# Patient Record
Sex: Female | Born: 1990 | Race: Black or African American | Hispanic: No | Marital: Married | Smoking: Never smoker
Health system: Southern US, Community
[De-identification: ages and names within clinical notes are randomized; demographics above are authoritative.]

## PROBLEM LIST (undated history)

## (undated) DIAGNOSIS — Z8632 Personal history of gestational diabetes: Secondary | ICD-10-CM

## (undated) DIAGNOSIS — E282 Polycystic ovarian syndrome: Secondary | ICD-10-CM

## (undated) DIAGNOSIS — G932 Benign intracranial hypertension: Secondary | ICD-10-CM

## (undated) DIAGNOSIS — O24419 Gestational diabetes mellitus in pregnancy, unspecified control: Secondary | ICD-10-CM

## (undated) DIAGNOSIS — E119 Type 2 diabetes mellitus without complications: Secondary | ICD-10-CM

## (undated) DIAGNOSIS — T7491XA Unspecified adult maltreatment, confirmed, initial encounter: Secondary | ICD-10-CM

## (undated) HISTORY — DX: Gestational diabetes mellitus in pregnancy, unspecified control: O24.419

## (undated) HISTORY — DX: Unspecified adult maltreatment, confirmed, initial encounter: T74.91XA

## (undated) HISTORY — DX: Polycystic ovarian syndrome: E28.2

## (undated) HISTORY — PX: NO PAST SURGERIES: SHX2092

## (undated) HISTORY — DX: Personal history of gestational diabetes: Z86.32

---

## 2017-06-14 LAB — OB RESULTS CONSOLE HEPATITIS B SURFACE ANTIGEN: HEP B S AG: NEGATIVE

## 2017-06-14 LAB — OB RESULTS CONSOLE ABO/RH: RH TYPE: POSITIVE

## 2017-06-14 LAB — OB RESULTS CONSOLE ANTIBODY SCREEN: Antibody Screen: NEGATIVE

## 2017-06-14 LAB — OB RESULTS CONSOLE RUBELLA ANTIBODY, IGM: Rubella: IMMUNE

## 2017-06-14 LAB — OB RESULTS CONSOLE HIV ANTIBODY (ROUTINE TESTING): HIV: NONREACTIVE

## 2017-06-14 LAB — OB RESULTS CONSOLE GC/CHLAMYDIA
CHLAMYDIA, DNA PROBE: NEGATIVE
Gonorrhea: NEGATIVE

## 2017-06-14 LAB — OB RESULTS CONSOLE RPR: RPR: NONREACTIVE

## 2017-06-17 ENCOUNTER — Inpatient Hospital Stay (HOSPITAL_COMMUNITY): Admission: AD | Admit: 2017-06-17 | Payer: Self-pay | Source: Ambulatory Visit | Admitting: Obstetrics and Gynecology

## 2017-09-16 ENCOUNTER — Other Ambulatory Visit: Payer: Self-pay | Admitting: Obstetrics and Gynecology

## 2017-09-16 DIAGNOSIS — N644 Mastodynia: Secondary | ICD-10-CM

## 2017-09-23 ENCOUNTER — Other Ambulatory Visit (HOSPITAL_COMMUNITY): Payer: Self-pay | Admitting: *Deleted

## 2017-09-23 DIAGNOSIS — N644 Mastodynia: Secondary | ICD-10-CM

## 2017-09-28 ENCOUNTER — Encounter: Payer: Medicaid Other | Attending: Obstetrics and Gynecology | Admitting: Registered"

## 2017-09-28 ENCOUNTER — Encounter: Payer: Self-pay | Admitting: Registered"

## 2017-09-28 DIAGNOSIS — O24410 Gestational diabetes mellitus in pregnancy, diet controlled: Secondary | ICD-10-CM | POA: Insufficient documentation

## 2017-09-28 DIAGNOSIS — Z713 Dietary counseling and surveillance: Secondary | ICD-10-CM | POA: Diagnosis not present

## 2017-09-28 DIAGNOSIS — R7309 Other abnormal glucose: Secondary | ICD-10-CM

## 2017-09-28 DIAGNOSIS — Z3A00 Weeks of gestation of pregnancy not specified: Secondary | ICD-10-CM | POA: Diagnosis not present

## 2017-09-28 NOTE — Progress Notes (Signed)
Patient was seen on 09/28/2017 for Gestational Diabetes self-management class at the [facility]. The following learning objectives were met by the patient during this course:   States the definition of Gestational Diabetes  States why dietary management is important in controlling blood glucose  Describes the effects each nutrient has on blood glucose levels  Demonstrates ability to create a balanced meal plan  Demonstrates carbohydrate counting   States when to check blood glucose levels  Demonstrates proper blood glucose monitoring techniques  States the effect of stress and exercise on blood glucose levels  States the importance of limiting caffeine and abstaining from alcohol and smoking  Blood glucose monitor given: Accu-Chek Guide Lot # [id number] Exp: 11/23/17 Blood glucose reading: 82  Patient instructed to monitor glucose levels: FBS: 60 - <95 1 hour: <140 2 hour: <120  Patient received handouts:  Nutrition Diabetes and Pregnancy  Carbohydrate Counting List  Patient will be seen for follow-up as needed.

## 2017-10-25 ENCOUNTER — Encounter (HOSPITAL_COMMUNITY): Payer: Self-pay | Admitting: *Deleted

## 2017-10-25 ENCOUNTER — Ambulatory Visit (HOSPITAL_COMMUNITY)
Admission: RE | Admit: 2017-10-25 | Discharge: 2017-10-25 | Disposition: A | Discharge status: Home / Self Care | Payer: Medicaid Other | Source: Ambulatory Visit | Attending: Obstetrics and Gynecology | Admitting: Obstetrics and Gynecology

## 2017-10-25 VITALS — BP 110/72 | Wt 201.0 lb

## 2017-10-25 DIAGNOSIS — Z1239 Encounter for other screening for malignant neoplasm of breast: Secondary | ICD-10-CM

## 2017-10-25 DIAGNOSIS — N644 Mastodynia: Secondary | ICD-10-CM

## 2017-10-25 HISTORY — DX: Type 2 diabetes mellitus without complications: E11.9

## 2017-10-25 NOTE — Progress Notes (Signed)
Complaints of left breast pain x 2 years that comes and goes. Patient rates the pain at a 4 out of 10.  Pap Smear: Pap smear not completed today. Last [facility] was in September 2018 [facility] and normal per patient. Per patient has no history of an abnormal Pap smear. No Pap smear results are in Epic.  Physical exam: Breasts Breasts symmetrical. No skin abnormalities bilateral breasts. No nipple retraction bilateral breasts. No nipple discharge bilateral breasts. No lymphadenopathy. No lumps palpated bilateral breasts. Complaints of left inner quadrant breast pain on exam. [facility] breast [facility]. Appointment scheduled for Thursday, October 27, 2017 at 1230.        Pelvic/Bimanual No Pap smear completed today since last Pap smear was in September 2018 per patient. Pap smear not indicated per BCCCP guidelines.   Smoking History: Patient has never smoked.  Patient Navigation: Patient education provided. Access [facility] services [facility]. Arabic interpreter provided.  Used Arabic interpreter Clemens CatholicSarra Gabralla from [employer].

## 2017-10-25 NOTE — Patient Instructions (Signed)
Explained breast self awareness with Mariavictoria Stgermaine. Patient did not need a Pap smear today due to last Pap smear was in September 2018 per patient. Let her know BCCCP will cover Pap smears every 3 years unless has a history of abnormal Pap smears. [facility] for [facility] left breast ultrasound. Appointment scheduled for Thursday, October 27, 2017 at 1230. Patient aware of appointment and will be there. Lelon Perlaida Dutkiewicz verbalized understanding.  Inola Lisle, Kathaleen Maserhristine Poll, RN 3:16 PM

## 2017-10-26 ENCOUNTER — Encounter (HOSPITAL_COMMUNITY): Payer: Self-pay | Admitting: *Deleted

## 2017-10-26 LAB — OB RESULTS CONSOLE GBS: GBS: NEGATIVE

## 2017-10-27 ENCOUNTER — Ambulatory Visit
Admission: RE | Admit: 2017-10-27 | Discharge: 2017-10-27 | Disposition: A | Discharge status: Home / Self Care | Payer: Medicaid Other | Source: Ambulatory Visit | Attending: Obstetrics and Gynecology | Admitting: Obstetrics and Gynecology

## 2017-10-27 DIAGNOSIS — N644 Mastodynia: Secondary | ICD-10-CM

## 2017-11-08 NOTE — L&D Delivery Note (Signed)
Operative Delivery Note Pt began pushing again and pushed for about 30 minutes bringing the vertex to a +2 station, vertex  visible at introitus with pushing.  Due to persistent variable decelerations with slower recovery, the patient and husband were counseled on the risks and benefits of a vacuum assisted delivery. The soft cup bell vacuum was applied  At an OP/+2 to +3 station and in the green zone, the baby delivered with 2 pulls over two contractions to crowning.  The vacuum was discontinued and the mother pushed out the remainder of head.    At 8:53 PM a viable female was delivered via Vaginal, Vacuum Investment banker, operational(Extractor).  Presentation: vertex; Position: Right,, Occiput,, Posterior; Station: +2.  Verbal consent: obtained from family.  Risks and benefits discussed in detail.  Risks include, but are not limited to the risks of anesthesia, bleeding, infection, damage to maternal tissues, fetal cephalhematoma.  There is also the risk of inability to effect vaginal delivery of the head, or shoulder dystocia that cannot be resolved by established maneuvers, leading to the need for emergency cesarean section.  APGAR  :8 ,8 ; weight pending  .   Placenta status: delivered spontaneously .   Cord:  with the following complications:none    Anesthesia: epidural Instruments: soft bell cup vacuum Episiotomy: None Lacerations:  Small 2nd degree Suture Repair: 2.0 vicryl rapide Est. Blood Loss (mL): 300mL There was mild atony that responded to bimanual massage, pitocin and bladder emptied of small amount of urine.  Mom to postpartum.  Baby to Couplet care / Skin to Skin.  Oliver PilaKathy W Mariha Sleeper 11/28/2017, 9:21 PM

## 2017-11-21 ENCOUNTER — Encounter (HOSPITAL_COMMUNITY): Payer: Self-pay | Admitting: *Deleted

## 2017-11-21 ENCOUNTER — Telehealth (HOSPITAL_COMMUNITY): Payer: Self-pay | Admitting: *Deleted

## 2017-11-21 NOTE — Telephone Encounter (Signed)
Preadmission screen Interpreter number [phone]

## 2017-11-27 NOTE — H&P (Signed)
Margaret Caldwell is a 27 y.o. female G2P1001 at 8639 5/7 weeks (EDD 11/30/17 by LMP c/w 14 week US)  presenting for IOL at term.  Pt is sudanese with primary language Arabic, but does speak some english as well. Prenatal care complicated by gestational diabetes, very well-controlled on diet.  She passed early screening at 16 weeks, but had an abnormal 3 hour GTT at 28 weeks.   OB History    Gravida Para Term Preterm AB Living   2 1 1     1    SAB TAB Ectopic Multiple Live Births           1    NSVD 572015 6#5oz  Past Medical History:  Diagnosis Date  . Diabetes mellitus without complication (HCC)    No past surgical history on file. Family History: family history includes Diabetes in her mother. Social History:  reports that  has never smoked. she has never used smokeless tobacco. She reports that she does not drink alcohol or use drugs.     Maternal Diabetes: Yes:  Diabetes Type:  Diet controlled Genetic Screening: Normal Maternal Ultrasounds/Referrals: Normal Fetal Ultrasounds or other Referrals:  None Maternal Substance Abuse:  No Significant Maternal Medications:  None Significant Maternal Lab Results:  None Other Comments:  None  Review of Systems  Gastrointestinal: Negative for abdominal pain and heartburn.  Neurological: Negative for headaches.   Maternal Medical History:  Contractions: Frequency: irregular.   Perceived severity is mild.    Fetal activity: Perceived fetal activity is normal.    Prenatal Complications - Diabetes: gestational. Diabetes is managed by diet.        There were no vitals taken for this visit. Maternal Exam:  Uterine Assessment: Contraction strength is mild.  Contraction frequency is irregular.   Abdomen: Patient reports no abdominal tenderness. Fetal presentation: vertex  Introitus: Normal vulva. Normal vagina.    Physical Exam  Constitutional: She appears well-developed and well-nourished.  Cardiovascular: Normal rate and regular rhythm.   Respiratory: Effort normal and breath sounds normal.  GI: Soft.  Genitourinary: Vagina normal.  Neurological: She is alert.  Psychiatric: She has a normal mood and affect.    Prenatal labs: ABO, Rh: O/Positive/-- (08/07 0000) Antibody: Negative (08/07 0000) Rubella: Immune (08/07 0000) RPR: Nonreactive (08/07 0000)  HBsAg: Negative (08/07 0000)  HIV: Non-reactive (08/07 0000)  GBS: Negative (12/19 0000)  First trimester screen and AFP negative Hgb AA Essential panel negative  Assessment/Plan: Pt for IOL at term. BS have been well-controlled.  Will check admission glucose, but as NPO will not need in labor.   Margaret Caldwell 11/27/2017, 8:29 PM

## 2017-11-28 ENCOUNTER — Inpatient Hospital Stay (HOSPITAL_COMMUNITY): Payer: Medicaid Other | Admitting: Anesthesiology

## 2017-11-28 ENCOUNTER — Inpatient Hospital Stay (HOSPITAL_COMMUNITY)
Admission: RE | Admit: 2017-11-28 | Discharge: 2017-11-30 | DRG: 807 | Disposition: A | Discharge status: Home / Self Care | Payer: Medicaid Other | Source: Ambulatory Visit | Attending: Obstetrics and Gynecology | Admitting: Obstetrics and Gynecology

## 2017-11-28 ENCOUNTER — Encounter (HOSPITAL_COMMUNITY): Payer: Self-pay

## 2017-11-28 DIAGNOSIS — Z3A39 39 weeks gestation of pregnancy: Secondary | ICD-10-CM

## 2017-11-28 DIAGNOSIS — O24420 Gestational diabetes mellitus in childbirth, diet controlled: Principal | ICD-10-CM | POA: Diagnosis present

## 2017-11-28 LAB — CBC
HEMATOCRIT: 34.4 % — AB (ref 36.0–46.0)
HEMOGLOBIN: 11.6 g/dL — AB (ref 12.0–15.0)
MCH: 27.9 pg (ref 26.0–34.0)
MCHC: 33.7 g/dL (ref 30.0–36.0)
MCV: 82.7 fL (ref 78.0–100.0)
Platelets: 206 10*3/uL (ref 150–400)
RBC: 4.16 MIL/uL (ref 3.87–5.11)
RDW: 13.2 % (ref 11.5–15.5)
WBC: 6.1 10*3/uL (ref 4.0–10.5)

## 2017-11-28 LAB — BASIC METABOLIC PANEL
Anion gap: 9 (ref 5–15)
BUN: 9 mg/dL (ref 6–20)
CALCIUM: 8.9 mg/dL (ref 8.9–10.3)
CO2: 19 mmol/L — ABNORMAL LOW (ref 22–32)
CREATININE: 0.73 mg/dL (ref 0.44–1.00)
Chloride: 104 mmol/L (ref 101–111)
GFR calc non Af Amer: >60 mL/min (ref >60)
Glucose, Bld: 85 mg/dL (ref 65–99)
Potassium: 3.9 mmol/L (ref 3.5–5.1)
SODIUM: 132 mmol/L — AB (ref 135–145)

## 2017-11-28 LAB — ABO/RH: ABO/RH(D): O POS

## 2017-11-28 LAB — TYPE AND SCREEN
ABO/RH(D): O POS
ANTIBODY SCREEN: NEGATIVE

## 2017-11-28 LAB — RPR: RPR Ser Ql: NONREACTIVE

## 2017-11-28 MED ORDER — ONDANSETRON HCL 4 MG/2ML IJ SOLN: 4.0000 mg | INTRAMUSCULAR | Status: DC | PRN

## 2017-11-28 MED ORDER — LIDOCAINE HCL (PF) 1 % IJ SOLN
30.0000 mL | INTRAMUSCULAR | Status: DC | PRN
  Administered 2017-11-28: 30 mL via SUBCUTANEOUS
  Filled 2017-11-28: qty 30

## 2017-11-28 MED ORDER — ACETAMINOPHEN 325 MG PO TABS: 650.0000 mg | ORAL_TABLET | ORAL | Status: DC | PRN

## 2017-11-28 MED ORDER — OXYTOCIN 40 UNITS IN LACTATED RINGERS INFUSION - SIMPLE MED
1.0000 m[IU]/min | INTRAVENOUS | Status: DC
  Administered 2017-11-28: 2 m[IU]/min via INTRAVENOUS
  Filled 2017-11-28: qty 1000

## 2017-11-28 MED ORDER — SENNOSIDES-DOCUSATE SODIUM 8.6-50 MG PO TABS
2.0000 | ORAL_TABLET | ORAL | Status: DC
  Administered 2017-11-29 (×2): 2 via ORAL
  Filled 2017-11-28: qty 2

## 2017-11-28 MED ORDER — BUTORPHANOL TARTRATE 1 MG/ML IJ SOLN: 1.0000 mg | INTRAMUSCULAR | Status: DC | PRN

## 2017-11-28 MED ORDER — LACTATED RINGERS IV SOLN
500.0000 mL | Freq: Once | INTRAVENOUS | Status: AC
  Administered 2017-11-28: 500 mL via INTRAVENOUS

## 2017-11-28 MED ORDER — OXYCODONE-ACETAMINOPHEN 5-325 MG PO TABS: 2.0000 | ORAL_TABLET | ORAL | Status: DC | PRN

## 2017-11-28 MED ORDER — IBUPROFEN 600 MG PO TABS
600.0000 mg | ORAL_TABLET | Freq: Four times a day (QID) | ORAL | Status: DC
  Administered 2017-11-29 – 2017-11-30 (×7): 600 mg via ORAL
  Filled 2017-11-28 (×6): qty 1

## 2017-11-28 MED ORDER — WITCH HAZEL-GLYCERIN EX PADS
1.0000 "application " | MEDICATED_PAD | CUTANEOUS | Status: DC | PRN
  Administered 2017-11-29: 1 via TOPICAL

## 2017-11-28 MED ORDER — OXYTOCIN BOLUS FROM INFUSION
500.0000 mL | Freq: Once | INTRAVENOUS | Status: AC
  Administered 2017-11-28: 500 mL via INTRAVENOUS

## 2017-11-28 MED ORDER — EPHEDRINE 5 MG/ML INJ
10.0000 mg | INTRAVENOUS | Status: DC | PRN
  Filled 2017-11-28: qty 2

## 2017-11-28 MED ORDER — ONDANSETRON HCL 4 MG/2ML IJ SOLN: 4.0000 mg | Freq: Four times a day (QID) | INTRAMUSCULAR | Status: DC | PRN

## 2017-11-28 MED ORDER — PRENATAL MULTIVITAMIN CH
1.0000 | ORAL_TABLET | Freq: Every day | ORAL | Status: DC
  Administered 2017-11-29 – 2017-11-30 (×2): 1 via ORAL
  Filled 2017-11-28 (×2): qty 1

## 2017-11-28 MED ORDER — FENTANYL 2.5 MCG/ML BUPIVACAINE 1/10 % EPIDURAL INFUSION (WH - ANES): 14.0000 mL/h | INTRAMUSCULAR | Status: DC | PRN

## 2017-11-28 MED ORDER — OXYTOCIN 40 UNITS IN LACTATED RINGERS INFUSION - SIMPLE MED: 2.5000 [IU]/h | INTRAVENOUS | Status: DC

## 2017-11-28 MED ORDER — DIPHENHYDRAMINE HCL 50 MG/ML IJ SOLN: 12.5000 mg | INTRAMUSCULAR | Status: DC | PRN

## 2017-11-28 MED ORDER — LACTATED RINGERS IV SOLN
INTRAVENOUS | Status: DC
  Administered 2017-11-28 (×2): via INTRAVENOUS
  Administered 2017-11-28: 125 mL/h via INTRAVENOUS

## 2017-11-28 MED ORDER — TERBUTALINE SULFATE 1 MG/ML IJ SOLN
0.2500 mg | Freq: Once | INTRAMUSCULAR | Status: DC | PRN
  Filled 2017-11-28: qty 1

## 2017-11-28 MED ORDER — PHENYLEPHRINE 40 MCG/ML (10ML) SYRINGE FOR IV PUSH (FOR BLOOD PRESSURE SUPPORT)
80.0000 ug | PREFILLED_SYRINGE | INTRAVENOUS | Status: DC | PRN
  Filled 2017-11-28: qty 10
  Filled 2017-11-28: qty 5

## 2017-11-28 MED ORDER — DIBUCAINE 1 % RE OINT: 1.0000 "application " | TOPICAL_OINTMENT | RECTAL | Status: DC | PRN

## 2017-11-28 MED ORDER — ACETAMINOPHEN 325 MG PO TABS
650.0000 mg | ORAL_TABLET | ORAL | Status: DC | PRN
  Administered 2017-11-29 (×2): 650 mg via ORAL
  Filled 2017-11-28 (×2): qty 2

## 2017-11-28 MED ORDER — TETANUS-DIPHTH-ACELL PERTUSSIS 5-2.5-18.5 LF-MCG/0.5 IM SUSP: 0.5000 mL | Freq: Once | INTRAMUSCULAR | Status: DC

## 2017-11-28 MED ORDER — PHENYLEPHRINE 40 MCG/ML (10ML) SYRINGE FOR IV PUSH (FOR BLOOD PRESSURE SUPPORT)
80.0000 ug | PREFILLED_SYRINGE | INTRAVENOUS | Status: DC | PRN
  Filled 2017-11-28: qty 5

## 2017-11-28 MED ORDER — LIDOCAINE HCL (PF) 1 % IJ SOLN
INTRAMUSCULAR | Status: DC | PRN
  Administered 2017-11-28 (×2): 4 mL

## 2017-11-28 MED ORDER — ONDANSETRON HCL 4 MG PO TABS: 4.0000 mg | ORAL_TABLET | ORAL | Status: DC | PRN

## 2017-11-28 MED ORDER — OXYCODONE HCL 5 MG PO TABS: 10.0000 mg | ORAL_TABLET | ORAL | Status: DC | PRN

## 2017-11-28 MED ORDER — COCONUT OIL OIL
1.0000 "application " | TOPICAL_OIL | Status: DC | PRN
  Administered 2017-11-30: 1 via TOPICAL
  Filled 2017-11-28: qty 120

## 2017-11-28 MED ORDER — SOD CITRATE-CITRIC ACID 500-334 MG/5ML PO SOLN: 30.0000 mL | ORAL | Status: DC | PRN

## 2017-11-28 MED ORDER — OXYCODONE-ACETAMINOPHEN 5-325 MG PO TABS: 1.0000 | ORAL_TABLET | ORAL | Status: DC | PRN

## 2017-11-28 MED ORDER — OXYCODONE HCL 5 MG PO TABS: 5.0000 mg | ORAL_TABLET | ORAL | Status: DC | PRN

## 2017-11-28 MED ORDER — DIPHENHYDRAMINE HCL 25 MG PO CAPS: 25.0000 mg | ORAL_CAPSULE | Freq: Four times a day (QID) | ORAL | Status: DC | PRN

## 2017-11-28 MED ORDER — FENTANYL 2.5 MCG/ML BUPIVACAINE 1/10 % EPIDURAL INFUSION (WH - ANES)
14.0000 mL/h | INTRAMUSCULAR | Status: DC | PRN
  Administered 2017-11-28 (×2): 14 mL/h via EPIDURAL
  Filled 2017-11-28 (×2): qty 100

## 2017-11-28 MED ORDER — SIMETHICONE 80 MG PO CHEW: 80.0000 mg | CHEWABLE_TABLET | ORAL | Status: DC | PRN

## 2017-11-28 MED ORDER — BENZOCAINE-MENTHOL 20-0.5 % EX AERO
1.0000 "application " | INHALATION_SPRAY | CUTANEOUS | Status: DC | PRN
  Administered 2017-11-29: 1 via TOPICAL
  Filled 2017-11-28: qty 56

## 2017-11-28 MED ORDER — LACTATED RINGERS IV SOLN: 500.0000 mL | INTRAVENOUS | Status: DC | PRN

## 2017-11-28 MED ORDER — ZOLPIDEM TARTRATE 5 MG PO TABS: 5.0000 mg | ORAL_TABLET | Freq: Every evening | ORAL | Status: DC | PRN

## 2017-11-28 NOTE — Progress Notes (Signed)
Interpreter left at 1350

## 2017-11-28 NOTE — Progress Notes (Signed)
Patient ID: Margaret Caldwell, female   DOB: 03/05/1991, 27 y.o.   MRN: [id number] Pt admitted and on pitocin Feeling mild cramping only  afeb vss FHR Category 1 AROM clear  Cervix 50/2+/-2   Pitocin per protocol Follow progress.

## 2017-11-28 NOTE — Anesthesia Procedure Notes (Signed)
Epidural Patient location during procedure: OB  Staffing Anesthesiologist: Jorden Minchey, MD Performed: anesthesiologist   Preanesthetic Checklist Completed: patient identified, pre-op evaluation, timeout performed, IV checked, risks and benefits discussed and monitors and equipment checked  Epidural Patient position: sitting Prep: site prepped and draped and DuraPrep Patient monitoring: heart rate, continuous pulse ox and blood pressure Approach: midline Location: L2-L3 Injection technique: LOR air and LOR saline  Needle:  Needle type: Tuohy  Needle gauge: 17 G Needle length: 9 cm Needle insertion depth: 7 cm Catheter type: closed end flexible Catheter size: 19 Gauge Catheter at skin depth: 12 cm Test dose: negative  Assessment Sensory level: T8 Events: blood not aspirated, injection not painful, no injection resistance, negative IV test and no paresthesia  Additional Notes Reason for block:procedure for pain     

## 2017-11-28 NOTE — Anesthesia Pain Management Evaluation Note (Signed)
  CRNA Pain Management Visit Note  Patient: Margaret Caldwell, 27 y.o., female  "Hello I am a member of the anesthesia [facility]. We have an anesthesia team available at all times to provide care throughout the hospital, including epidural management and anesthesia for C-section. I don't know your plan for the delivery whether it a natural birth, water birth, IV sedation, nitrous supplementation, doula or epidural, but we want to meet your pain goals."   1.Was your pain managed to your expectations on prior hospitalizations?   Yes   2.What is your expectation for pain management during this hospitalization?     Epidural  3.How can we help you reach that goal? epidural  Record the patient's initial score and the patient's pain goal.   Pain: 0/10  Pain Goal: 2/10 [facility] wants you to be able to say your pain was always managed very well.  Salome ArntSterling, Lamarius Dirr Marie 11/28/2017

## 2017-11-28 NOTE — Progress Notes (Signed)
Patient ID: Margaret Caldwell, female   DOB: 07/13/1991, 27 y.o.   MRN: [id number] Pt reached complete dilation at 700pm  FHR with good variability and accels, variable decelerations with contractions Pushed with patient about 30-40 minutes and progressed vertex from 0 station to +1 almost +2 station.  Pt got tired and pushed less effectively so taking a 15-20 minute break, then will resume.  Baby still OP.

## 2017-11-28 NOTE — Progress Notes (Signed)
Patient ID: Margaret Caldwell, female   DOB: 09/17/1991, 27 y.o.   MRN: [id number] Pt comfortable with epidural  afeb vss FHR with good variability Mild variables with contractions  Cervix 80/7-8/0 OP  Persistent OP lie, will continue to position change Follow progress

## 2017-11-28 NOTE — Anesthesia Preprocedure Evaluation (Signed)
Anesthesia Evaluation  Patient identified by MRN, date of birth, ID band Patient awake    Reviewed: Allergy & Precautions, NPO status , Patient's Chart, lab work & pertinent test results  Airway Mallampati: II  TM Distance: >3 FB Neck ROM: Full    Dental no notable dental hx.    Pulmonary neg pulmonary ROS,    Pulmonary exam normal breath sounds clear to auscultation       Cardiovascular negative cardio ROS Normal cardiovascular exam Rhythm:Regular Rate:Normal     Neuro/Psych negative neurological ROS  negative psych ROS   GI/Hepatic negative GI ROS, Neg liver ROS,   Endo/Other  diabetes  Renal/GU negative Renal ROS     Musculoskeletal negative musculoskeletal ROS (+)   Abdominal (+) + obese,   Peds  Hematology negative hematology ROS (+)   Anesthesia Other Findings   Reproductive/Obstetrics (+) Pregnancy                            Anesthesia Physical Anesthesia Plan  ASA: II  Anesthesia Plan: Epidural   Post-op Pain Management:    Induction:   PONV Risk Score and Plan:   Airway Management Planned:   Additional Equipment:   Intra-op Plan:   Post-operative Plan:   Informed Consent: I have reviewed the patients History and Physical, chart, labs and discussed the procedure including the risks, benefits and alternatives for the proposed anesthesia with the patient or authorized representative who has indicated his/her understanding and acceptance.     Plan Discussed with:   Anesthesia Plan Comments:         Anesthesia Quick Evaluation

## 2017-11-28 NOTE — Progress Notes (Signed)
Interpreter with patient.  Reinaldo RaddleFaliza Ohag from [facility] interpreting for pt

## 2017-11-28 NOTE — Progress Notes (Signed)
Patient ID: Margaret Caldwell, female   DOB: 06/26/1991, 27 y.o.   MRN: [id number] Pt uncomfortable and considering epidural  afeb VSS FHR category 1  Cervix 70/4/-1  To get epidural

## 2017-11-29 ENCOUNTER — Other Ambulatory Visit: Payer: Self-pay

## 2017-11-29 LAB — CBC
HCT: 30.1 % — ABNORMAL LOW (ref 36.0–46.0)
Hemoglobin: 10.1 g/dL — ABNORMAL LOW (ref 12.0–15.0)
MCH: 28.1 pg (ref 26.0–34.0)
MCHC: 33.6 g/dL (ref 30.0–36.0)
MCV: 83.8 fL (ref 78.0–100.0)
Platelets: 153 10*3/uL (ref 150–400)
RBC: 3.59 MIL/uL — ABNORMAL LOW (ref 3.87–5.11)
RDW: 13 % (ref 11.5–15.5)
WBC: 8.7 10*3/uL (ref 4.0–10.5)

## 2017-11-29 NOTE — Anesthesia Postprocedure Evaluation (Signed)
Anesthesia Post Note  Patient: Margaret Caldwell  Procedure(s) Performed: AN AD HOC LABOR EPIDURAL     Patient location during evaluation: Mother Baby Anesthesia Type: Epidural Level of consciousness: awake and alert Pain management: pain level controlled Vital Signs Assessment: post-procedure vital signs reviewed and stable Respiratory status: spontaneous breathing and nonlabored ventilation Cardiovascular status: stable Postop Assessment: no headache, no backache, epidural receding, no apparent nausea or vomiting, patient able to bend at knees and adequate PO intake Anesthetic complications: no    Last Vitals:  Vitals:   11/29/17 0052 11/29/17 0617  BP: 115/63 98/66  Pulse: 90 88  Resp: 18 18  Temp: 36.6 C 36.6 C  SpO2:      Last Pain:  Vitals:   11/29/17 0617  TempSrc: Oral  PainSc:    Pain Goal:                 Laban EmperorMalinova,Tatsuo Musial Hristova

## 2017-11-29 NOTE — Progress Notes (Signed)
PPD #1 No problems Afeb, VSS Fundus firm, NT at U-1 Continue routine postpartum care 

## 2017-11-29 NOTE — Lactation Note (Signed)
This note was copied from a baby's chart. Lactation Consultation Note  Patient Name: Girl Mickle Malloryida Salih ZOXWR'UToday's Date: 11/29/2017 Reason for consult: Initial assessment;Term Breastfeeding consultation services and support information given to patient. Experienced breastfeeding previous baby.  Mom reports baby is feeding well and no concerns.  Instructed to feed with cues and call for assist prn.  Maternal Data Does the patient have breastfeeding experience prior to this delivery?: Yes  Feeding Feeding Type: Breast Fed Length of feed: 30 min  LATCH Score Latch: Repeated attempts needed to sustain latch, nipple held in mouth throughout feeding, stimulation needed to elicit sucking reflex.  Audible Swallowing: A few with stimulation  Type of Nipple: Everted at rest and after stimulation  Comfort (Breast/Nipple): Soft / non-tender  Hold (Positioning): No assistance needed to correctly position infant at breast.  LATCH Score: 8  Interventions    Lactation Tools Discussed/Used     Consult Status Consult Status: Follow-up Date: 11/30/17 Follow-up type: In-patient    Huston FoleyMOULDEN, Chiquitta Matty S 11/29/2017, 10:47 AM

## 2017-11-30 MED ORDER — IBUPROFEN 600 MG PO TABS: 600.0000 mg | ORAL_TABLET | Freq: Four times a day (QID) | ORAL | 1 refills | Status: DC | PRN

## 2017-11-30 MED ORDER — PRENATAL 27-0.8 MG PO TABS: 1.0000 | ORAL_TABLET | Freq: Every day | ORAL | 3 refills | Status: DC

## 2017-11-30 NOTE — Discharge Summary (Signed)
OB Discharge Summary     Patient Name: Margaret Caldwell DOB: Apr 07, 1991 MRN: [id number]  Date of admission: 11/28/2017 Delivering MD: Huel Cote   Date of discharge: 11/30/2017  Admitting diagnosis: INDUCTION Intrauterine pregnancy: [redacted]w[redacted]d     Secondary diagnosis:  Active Problems:   Gestational diabetes mellitus (GDM) in childbirth, diet controlled   Vacuum extractor delivery, delivered  Additional problems: none     Discharge diagnosis: Term Pregnancy Delivered and GDM A1                                                                                                Post partum procedures:none  Augmentation: AROM and Pitocin  Complications: None  Hospital course:  Induction of Labor With Vaginal Delivery   27 y.o. yo G2P2002 at [redacted]w[redacted]d was admitted to the hospital 11/28/2017 for induction of labor.  Indication for induction: Favorable cervix at term and A1 DM.  Patient had an uncomplicated labor course as follows: Membrane Rupture Time/Date: 9:07 AM ,11/28/2017   Intrapartum Procedures: Episiotomy: None [1]                                         Lacerations:  2nd degree [3]  Patient had delivery of a Viable infant.  Information for the patient's newborn:  Kecia, Swoboda [[id number]]  Delivery Method: Vaginal, Vacuum (Extractor)(Filed from Delivery Summary)   11/28/2017  Details of delivery can be found in separate delivery note.  Patient had a routine postpartum course. Patient is discharged home 11/30/17.  Physical exam  Vitals:   11/29/17 0617 11/29/17 1259 11/29/17 1815 11/30/17 0722  BP: 98/66 104/63 109/67 (!) 97/49  Pulse: 88 91 90 82  Resp: 18 18 18 18   Temp: 97.8 F (36.6 C) 98.2 F (36.8 C) 98.3 F (36.8 C) 97.8 F (36.6 C)  TempSrc: Oral Oral Oral Oral  SpO2:      Weight:      Height:       General: alert and no distress Lochia: appropriate Uterine Fundus: firm  Labs: Lab Results  Component Value Date   WBC 8.7 11/29/2017   HGB 10.1 (L)  11/29/2017   HCT 30.1 (L) 11/29/2017   MCV 83.8 11/29/2017   PLT 153 11/29/2017   CMP Latest Ref Rng & Units 11/28/2017  Glucose 65 - 99 mg/dL 85  BUN 6 - 20 mg/dL 9  Creatinine 7.82 - 9.56 mg/dL 2.13  Sodium 086 - 578 mmol/L 132(L)  Potassium 3.5 - 5.1 mmol/L 3.9  Chloride 101 - 111 mmol/L 104  CO2 22 - 32 mmol/L 19(L)  Calcium 8.9 - 10.3 mg/dL 8.9    Discharge instruction: per After Visit Summary and "Baby and Me Booklet".  After visit meds:  Allergies as of 11/30/2017   No Known Allergies     Medication List    TAKE these medications   ibuprofen 600 MG tablet Commonly known as:  ADVIL,MOTRIN Take 1 tablet (600 mg total) by mouth every 6 (six) hours as  needed.   multivitamin-prenatal 27-0.8 MG Tabs tablet Take 1 tablet by mouth daily at 12 noon.       Diet: routine diet  Activity: Advance as tolerated. Pelvic rest for 6 weeks.   Outpatient follow up:6 weeks Follow up Appt:No future appointments. Follow up Visit:No Follow-up on file.  Postpartum contraception: Undecided  Newborn Data: Live born female  Birth Weight: 6 lb 1.9 oz (2775 g) APGAR: 8, 8  Newborn Delivery   Birth date/time:  11/28/2017 20:53:00 Delivery type:  Vaginal, Vacuum (Extractor)     Baby Feeding: Breast Disposition:home with mother   11/30/2017 Sherian ReinJody Bovard-Stuckert, MD

## 2017-11-30 NOTE — Progress Notes (Signed)
Post Partum Day 2 Subjective: no complaints, up ad lib, voiding, tolerating PO and nl lochia, pain controlled  Objective: Blood pressure (!) 97/49, pulse 82, temperature 97.8 F (36.6 C), temperature source Oral, resp. rate 18, height 5\' 3"  (1.6 m), weight 91.2 kg (201 lb), SpO2 100 %, unknown if currently breastfeeding.  Physical Exam:  General: alert and no distress Lochia: appropriate Uterine Fundus: firm   Recent Labs    11/28/17 0730 11/29/17 0607  HGB 11.6* 10.1*  HCT 34.4* 30.1*    Assessment/Plan: Discharge home, Breastfeeding and Lactation consult.  Routine PP care.  D/c with motrin and PNV.     LOS: 2 days   Peterson Mathey Bovard-Stuckert 11/30/2017, 8:15 AM

## 2017-11-30 NOTE — Lactation Note (Signed)
This note was copied from a baby's chart. Lactation Consultation Note  Patient Name: Margaret Caldwell's Date: 11/30/2017 Reason for consult: Follow-up assessment   P2, Baby had not breastfed since 0103 this morning (8 hours without feeding) this was confirmed with parents. Baby crying, cueing upon entering the room.  Suggest feeding baby now. Mom encouraged to feed baby 8-12 times/24 hours and with feeding cues.  Suggest breastfeeding baby on demand with cues but not going for more than 3 hours without placing baby STS to interest baby in feeding. Observed mother latching baby.  Sucks and swallows observed after a few latch attempts. Recommend compressing breast during feeding, turning baby tummy to tummy and bf on both breasts per feeding. Offered pillows for support to bring baby to nipple height. Reviewed engorgement care and monitoring voids/stools.     Maternal Data Has patient been taught Hand Expression?: Yes  Feeding Feeding Type: Breast Fed  LATCH Score Latch: Grasps breast easily, tongue down, lips flanged, rhythmical sucking.  Audible Swallowing: A few with stimulation  Type of Nipple: Everted at rest and after stimulation  Comfort (Breast/Nipple): Soft / non-tender  Hold (Positioning): Assistance needed to correctly position infant at breast and maintain latch.  LATCH Score: 8  Interventions Interventions: Breast feeding basics reviewed  Lactation Tools Discussed/Used     Consult Status Consult Status: Complete    Hardie PulleyBerkelhammer, Tyre Beaver Boschen 11/30/2017, 9:18 AM

## 2019-07-18 ENCOUNTER — Emergency Department (HOSPITAL_COMMUNITY): Payer: Medicaid Other

## 2019-07-18 ENCOUNTER — Other Ambulatory Visit: Payer: Self-pay

## 2019-07-18 ENCOUNTER — Emergency Department (HOSPITAL_COMMUNITY)
Admission: EM | Admit: 2019-07-18 | Discharge: 2019-07-19 | Disposition: A | Discharge status: Home / Self Care | Payer: Medicaid Other | Attending: Emergency Medicine | Admitting: Emergency Medicine

## 2019-07-18 DIAGNOSIS — E119 Type 2 diabetes mellitus without complications: Secondary | ICD-10-CM | POA: Insufficient documentation

## 2019-07-18 DIAGNOSIS — H4710 Unspecified papilledema: Secondary | ICD-10-CM

## 2019-07-18 DIAGNOSIS — H5711 Ocular pain, right eye: Secondary | ICD-10-CM | POA: Diagnosis present

## 2019-07-18 LAB — BASIC METABOLIC PANEL
Anion gap: 10 (ref 5–15)
BUN: 10 mg/dL (ref 6–20)
CO2: 25 mmol/L (ref 22–32)
Calcium: 9.3 mg/dL (ref 8.9–10.3)
Chloride: 105 mmol/L (ref 98–111)
Creatinine, Ser: 0.63 mg/dL (ref 0.44–1.00)
GFR calc Af Amer: >60 mL/min (ref >60)
GFR calc non Af Amer: >60 mL/min (ref >60)
Glucose, Bld: 90 mg/dL (ref 70–99)
Potassium: 4.1 mmol/L (ref 3.5–5.1)
Sodium: 140 mmol/L (ref 135–145)

## 2019-07-18 LAB — CBC WITH DIFFERENTIAL/PLATELET
Abs Immature Granulocytes: 0.01 10*3/uL (ref 0.00–0.07)
Basophils Absolute: 0 10*3/uL (ref 0.0–0.1)
Basophils Relative: 0 %
Eosinophils Absolute: 0.2 10*3/uL (ref 0.0–0.5)
Eosinophils Relative: 4 %
HCT: 40.1 % (ref 36.0–46.0)
Hemoglobin: 13.1 g/dL (ref 12.0–15.0)
Immature Granulocytes: 0 %
Lymphocytes Relative: 42 %
Lymphs Abs: 2.2 10*3/uL (ref 0.7–4.0)
MCH: 28.6 pg (ref 26.0–34.0)
MCHC: 32.7 g/dL (ref 30.0–36.0)
MCV: 87.6 fL (ref 80.0–100.0)
Monocytes Absolute: 0.3 10*3/uL (ref 0.1–1.0)
Monocytes Relative: 6 %
Neutro Abs: 2.5 10*3/uL (ref 1.7–7.7)
Neutrophils Relative %: 48 %
Platelets: 287 10*3/uL (ref 150–400)
RBC: 4.58 MIL/uL (ref 3.87–5.11)
RDW: 12.6 % (ref 11.5–15.5)
WBC: 5.3 10*3/uL (ref 4.0–10.5)
nRBC: 0 % (ref 0.0–0.2)

## 2019-07-18 MED ORDER — GADOBUTROL 1 MMOL/ML IV SOLN
10.0000 mL | Freq: Once | INTRAVENOUS | Status: AC | PRN
  Administered 2019-07-18: 23:00:00 10 mL via INTRAVENOUS

## 2019-07-18 NOTE — ED Notes (Signed)
Pt moved to a stretcher bed for comfort  No idea what time the  Pt will go to [facility]

## 2019-07-18 NOTE — ED Notes (Signed)
THE PT REPORTS THAT SHE HAS A HEADACHE ALSO

## 2019-07-18 NOTE — ED Provider Notes (Signed)
Signout from [employer], PA-C at shift change See previous providers note for full H&P  Briefly, patient presents with 1 year history of eye pressure, sent from ophthalmology.  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  A colleague of Dr. Kathleen Argue sent her for MR brain w and w/o. If normal, order MRV brain with contrast. If normal, follow up in the office with her ophthalmologist.  Physical Exam  BP 122/86 (BP Location: Left Arm)   Pulse 85   Temp 98.7 F (37.1 C) (Oral)   Resp 16   Ht 5\' 2"  (1.575 m)   Wt 99.8 kg   SpO2 100%   BMI 40.24 kg/m   Physical Exam Vitals signs and nursing note reviewed.  Constitutional:      General: She is not in acute distress.    Appearance: She is well-developed. She is not diaphoretic.  HENT:     Head: Normocephalic and atraumatic.  Eyes:     General: No scleral icterus.       Right eye: No discharge.        Left eye: No discharge.     Conjunctiva/sclera: Conjunctivae normal.  Pulmonary:     Effort: Pulmonary effort is normal. No respiratory distress.  Skin:    General: Skin is warm and dry.     Coloration: Skin is not pale.     Findings: No rash.  Neurological:     Mental Status: She is alert and oriented to person, place, and time.     Coordination: Coordination normal.  Psychiatric:        Behavior: Behavior normal.        Thought Content: Thought content normal.        Judgment: Judgment normal.     ED Course/Procedures     Procedures  MDM  Patient waiting for MRI at shift change.  This was normal.  MRV was ordered, however after 11 hours waiting in the emergency department, patient does not want to wait any longer for the MRV.  Feel this is reasonable considering symptoms have been present for 1 year.  She is to follow-up with Dr. Kathlen Mody for further evaluation and outpatient scheduling of MRV.  Labs are unremarkable.  Patient given treatment for headache in the ED and is feeling better.  Prior to Toradol, patient denied possibility of  pregnancy at this time.  Patient understands and agrees with plan.  Given return precautions.  Patient vital stable throughout ED course and discharged in satisfactory condition.  Patient also evaluated by my attending, Dr. Christy Gentles, who guided the patient's management and agrees with plan.       Frederica Kuster, PA-C 07/19/19 0234    Ripley Fraise, MD 07/19/19 [phone]

## 2019-07-18 NOTE — ED Provider Notes (Signed)
[place] EMERGENCY DEPARTMENT Provider Note   CSN: [id number] Arrival date & time: 07/18/19  1531     History   Chief Complaint Chief Complaint  Patient presents with  . Eye Pain    HPI Margaret Caldwell is a 28 y.o. female.     HPI   28 year old female presents today for MRI of her brain.  She notes that over the last year she has felt as if there was something on her right eye.  She denies any associated eye pain or visual loss.  She followed up with an ophthalmologist today who sent her to the emergency room for further imaging.  I discussed this with Dr. Kathlen Mody as his colleague was the one who sent the patient over, this patient was sent for an MRI of the brain secondary to disc edema.  Patient denies any significant headache or neurological deficits.  Past Medical History:  Diagnosis Date  . Diabetes mellitus without complication [facility]     Patient Active Problem List   Diagnosis Date Noted  . Gestational diabetes mellitus (GDM) in childbirth, diet controlled 11/28/2017  . Vacuum extractor delivery, delivered 11/28/2017  . Impaired glucose tolerance test 09/28/2017    No past surgical history on file.   OB History    Gravida  2   Para  2   Term  2   Preterm      AB      Living  2     SAB      TAB      Ectopic      Multiple  0   Live Births  2            Home Medications    Prior to Admission medications   Medication Sig Start Date End Date Taking? Authorizing Provider  ibuprofen (ADVIL,MOTRIN) 600 MG tablet Take 1 tablet (600 mg total) by mouth every 6 (six) hours as needed. 11/30/17   Bovard-Stuckert, Jeral Fruit, MD  Prenatal Vit-Fe Fumarate-FA (MULTIVITAMIN-PRENATAL) 27-0.8 MG TABS tablet Take 1 tablet by mouth daily at 12 noon. 11/30/17   Bovard-StuckertJeral Fruit, MD    Family History Family History  Problem Relation Age of Onset  . Diabetes Mother     Social History Social History   Tobacco Use  . Smoking status:  Never Smoker  . Smokeless tobacco: Never Used  Substance Use Topics  . Alcohol use: No    Frequency: Never  . Drug use: No     Allergies   Patient has no known allergies.   Review of Systems Review of Systems  All other systems reviewed and are negative.    Physical Exam Updated Vital Signs BP 122/70 (BP Location: Left Arm)   Pulse (!) 59   Temp 98.2 F (36.8 C) (Oral)   Resp 18   Ht 5\' 2"  (1.575 m)   Wt 99.8 kg   SpO2 100%   BMI 40.24 kg/m   Physical Exam Vitals signs and nursing note reviewed.  Constitutional:      Appearance: She is well-developed.  HENT:     Head: Normocephalic and atraumatic.  Eyes:     General: No scleral icterus.       Right eye: No discharge.        Left eye: No discharge.     Conjunctiva/sclera: Conjunctivae normal.     Pupils: Pupils are equal, round, and reactive to light.     Comments: Pupils are equal round and reactive  to light, conjunctiva without injection, extraocular movements intact and pain-free-vision equal bilateral  Neck:     Musculoskeletal: Normal range of motion.     Vascular: No JVD.     Trachea: No tracheal deviation.  Pulmonary:     Effort: Pulmonary effort is normal.     Breath sounds: No stridor.  Neurological:     General: No focal deficit present.     Mental Status: She is alert and oriented to person, place, and time.     Cranial Nerves: No cranial nerve deficit.     Motor: No weakness.     Coordination: Coordination normal.     Gait: Gait normal.  Psychiatric:        Behavior: Behavior normal.        Thought Content: Thought content normal.        Judgment: Judgment normal.      ED Treatments / Results  Labs (all labs ordered are listed, but only abnormal results are displayed) Labs Reviewed  CBC WITH DIFFERENTIAL/PLATELET  BASIC METABOLIC PANEL    EKG None  Radiology Mr Laqueta Jean And Wo Contrast  Result Date: 07/18/2019 CLINICAL DATA:  Vision loss and eye pain EXAM: MRI HEAD WITHOUT AND  WITH CONTRAST TECHNIQUE: Multiplanar, multiecho pulse sequences of the brain and surrounding structures were obtained without and with intravenous contrast. CONTRAST:  30mL GADAVIST GADOBUTROL 1 MMOL/ML IV SOLN COMPARISON:  None. FINDINGS: BRAIN: There is no acute infarct, acute hemorrhage or extra-axial collection. The white matter signal is normal for the patient's age. The cerebral and cerebellar volume are age-appropriate. There is no hydrocephalus. The midline structures are normal. VASCULAR: The major intracranial arterial and venous sinus flow voids are normal. Susceptibility-sensitive sequences show no chronic microhemorrhage or superficial siderosis. SKULL AND UPPER CERVICAL SPINE: Calvarial bone marrow signal is normal. There is no skull base mass. The visualized upper cervical spine and soft tissues are normal. SINUSES/ORBITS: There are no fluid levels or advanced mucosal thickening. The mastoid air cells and middle ear cavities are free of fluid. The orbits are normal. IMPRESSION: Normal brain MRI. Electronically Signed   By: Deatra Robinson M.D.   On: 07/18/2019 22:59    Procedures Procedures (including critical care time)  Medications Ordered in ED Medications  gadobutrol (GADAVIST) 1 MMOL/ML injection 10 mL (10 mLs Intravenous Contrast Given 07/18/19 2248)  metoCLOPramide (REGLAN) injection 10 mg (10 mg Intravenous Given 07/19/19 0219)  sodium chloride 0.9 % bolus 500 mL (0 mLs Intravenous Stopped 07/19/19 0316)  ketorolac (TORADOL) 30 MG/ML injection 15 mg (15 mg Intravenous Given 07/19/19 0219)     Initial Impression / Assessment and Plan / ED Course  I have reviewed the triage vital signs and the nursing notes.  Pertinent labs & imaging results that were available during my care of the patient were reviewed by me and considered in my medical decision making (see chart for details).         28 year old female presents today with disc edema of her right eye.  I discussed case with  Dr. Alben Spittle on-call ophthalmologist who recommends that this patient receive an MRI of her brain with and without contrast.  If this is negative she will need an MRV.  If this is negative as well she is stable for outpatient follow-up with ophthalmology no further evaluation needed at this time.  We will order MR brain with and without at this time  Patient care signed to oncoming provider pending imaging results and disposition  Final Clinical Impressions(s) / ED Diagnoses   Final diagnoses:  Optic disc edema    ED Discharge Orders    None       Eyvonne MechanicHedges, Rosie Torrez, PA-C 07/19/19 1356    Charlynne PanderYao, David Hsienta, MD 07/19/19 [phone]

## 2019-07-18 NOTE — ED Notes (Signed)
THE PT REPORT THAT SHE HAS HAD EYE PROBLEMS FOR 1-2 YEARS.  SHE SAW AN EYE DOCTOR EARLIER TODAY THAT REPORTED THAT SHE NEEDED A MRI.

## 2019-07-18 NOTE — ED Triage Notes (Signed)
Pt sent by eye doctor for eye pain x2 years. Per pt, referring MD saw something in her retinal scan that requires further imaging. Pt states she has not had any vision deficit. Pt is alert and oriented at this time. Pain rated at 2/10.

## 2019-07-19 ENCOUNTER — Other Ambulatory Visit: Payer: Self-pay | Admitting: Physician Assistant

## 2019-07-19 DIAGNOSIS — N644 Mastodynia: Secondary | ICD-10-CM

## 2019-07-19 MED ORDER — SODIUM CHLORIDE 0.9 % IV BOLUS
500.0000 mL | Freq: Once | INTRAVENOUS | Status: AC
  Administered 2019-07-19: 02:00:00 500 mL via INTRAVENOUS

## 2019-07-19 MED ORDER — KETOROLAC TROMETHAMINE 30 MG/ML IJ SOLN
15.0000 mg | Freq: Once | INTRAMUSCULAR | Status: AC
  Administered 2019-07-19: 15 mg via INTRAVENOUS
  Filled 2019-07-19: qty 1

## 2019-07-19 MED ORDER — METOCLOPRAMIDE HCL 5 MG/ML IJ SOLN
10.0000 mg | Freq: Once | INTRAMUSCULAR | Status: AC
  Administered 2019-07-19: 02:00:00 10 mg via INTRAVENOUS
  Filled 2019-07-19: qty 2

## 2019-07-19 NOTE — ED Provider Notes (Signed)
Patient seen/examined in the Emergency Department in conjunction with [employer] Provider [employer]  Patient reports eye pressure for awhile and now having HA Exam : awake/alert, no distress Plan: awaiting MRV per ophthalmology instructions If negative she will be discharged for f/u as outpatient    Ripley Fraise, MD 07/19/19 0050

## 2019-07-19 NOTE — Discharge Instructions (Addendum)
Please follow-up with your ophthalmologist for further evaluation and treatment of your optic disc edema that he saw today.  Your MRI of your brain was normal, however he wanted you to have the MRV of your brain.  You have chosen to leave before this was done today.  Please follow-up with your ophthalmologist to schedule this outpatient.  Please return the emergency department if you develop any new or worsening symptoms.

## 2019-07-19 NOTE — ED Notes (Signed)
Pt is upset that she is still here she wants to go home  Pa at the bedside

## 2019-07-22 ENCOUNTER — Other Ambulatory Visit: Payer: Self-pay

## 2019-07-22 ENCOUNTER — Emergency Department (HOSPITAL_COMMUNITY)
Admission: EM | Admit: 2019-07-22 | Discharge: 2019-07-22 | Disposition: A | Discharge status: Home / Self Care | Payer: Medicaid Other | Attending: Emergency Medicine | Admitting: Emergency Medicine

## 2019-07-22 ENCOUNTER — Encounter (HOSPITAL_COMMUNITY): Payer: Self-pay

## 2019-07-22 ENCOUNTER — Emergency Department (HOSPITAL_COMMUNITY): Payer: Medicaid Other

## 2019-07-22 DIAGNOSIS — G8929 Other chronic pain: Secondary | ICD-10-CM

## 2019-07-22 DIAGNOSIS — R51 Headache: Secondary | ICD-10-CM | POA: Insufficient documentation

## 2019-07-22 DIAGNOSIS — E119 Type 2 diabetes mellitus without complications: Secondary | ICD-10-CM | POA: Diagnosis not present

## 2019-07-22 LAB — POC URINE PREG, ED: Preg Test, Ur: NEGATIVE

## 2019-07-22 MED ORDER — GADOBUTROL 1 MMOL/ML IV SOLN
10.0000 mL | Freq: Once | INTRAVENOUS | Status: AC | PRN
  Administered 2019-07-22: 10 mL via INTRAVENOUS

## 2019-07-22 NOTE — ED Triage Notes (Signed)
Patient seen in [place] last week for eye pain, returning now for follow up MRI. Patient reports no current eye pain but does report headache, neck and shoulder pain on the right side at a 5/10, and generalized exhaustion.

## 2019-07-22 NOTE — ED Provider Notes (Signed)
[facility]   CSN: [id number] Arrival date & time: 07/22/19  1242     History   Chief Complaint Chief Complaint  Patient presents with   Eye Problem    HPI Margaret Caldwell is a 28 y.o. female with h/o gestational DM presents to ER for MRI.  She reports 1-2 years of headaches approximately 2-3 a week, usually generalized associated with intermittent blurred vision in the right eye and sensation like "there is something in there" that she needs to wipe off.  She has none of these symptoms currently. She was seen by [facility] Dr Krista BlueeMarco (optometry) who told her there was swelling in the back of her eye and referred her to ER on 07/18/2019.  EDPA at that visit called ophthalmologist and spoke to Dr Alben SpittleWeaver who recommended MRI brain with and without contrast, if negative follow up with MRV.  Patient waited for 11 hours and requested to be discharged without obtaining MRV. MRI was normal. She states she is here today to obtain the MRV done. She has an appointment with ophthalmology this coming Wednesday for re-evaluation and she would like the MR to be done before her visit.  She denies any changes to her chronic symptoms.      HPI  Past Medical History:  Diagnosis Date   Diabetes mellitus without complication [facility](HCC)     Patient Active Problem List   Diagnosis Date Noted   Gestational diabetes mellitus (GDM) in childbirth, diet controlled 11/28/2017   Vacuum extractor delivery, delivered 11/28/2017   Impaired glucose tolerance test 09/28/2017    History reviewed. No pertinent surgical history.   OB History    Gravida  2   Para  2   Term  2   Preterm      AB      Living  2     SAB      TAB      Ectopic      Multiple  0   Live Births  2            Home Medications    Prior to Admission medications   Medication Sig Start Date End Date Taking? Authorizing Provider  ibuprofen  (ADVIL,MOTRIN) 600 MG tablet Take 1 tablet (600 mg total) by mouth every 6 (six) hours as needed. 11/30/17   Bovard-Stuckert, Augusto GambleJody, MD  Prenatal Vit-Fe Fumarate-FA (MULTIVITAMIN-PRENATAL) 27-0.8 MG TABS tablet Take 1 tablet by mouth daily at 12 noon. 11/30/17   Bovard-Stuckert, Augusto GambleJody, MD    Family History Family History  Problem Relation Age of Onset   Diabetes Mother     Social History Social History   Tobacco Use   Smoking status: Never Smoker   Smokeless tobacco: Never Used  Substance Use Topics   Alcohol use: No    Frequency: Never   Drug use: No     Allergies   Patient has no known allergies.   Review of Systems Review of Systems  All other systems reviewed and are negative.    Physical Exam Updated Vital Signs BP (!) 133/116 (BP Location: Right Arm)    Pulse 93    Temp 98.5 F (36.9 C) (Oral)    Resp 16    Ht 5\' 2"  (1.575 m)    Wt 99.8 kg    SpO2 99%    BMI 40.24 kg/m   Physical Exam Constitutional:      Appearance: She is well-developed.  HENT:  Head: Normocephalic.     Nose: Nose normal.  Eyes:     General: Lids are normal.     Extraocular Movements: Extraocular movements intact.     Conjunctiva/sclera: Conjunctivae normal.     Pupils: Pupils are equal, round, and reactive to light.  Neck:     Musculoskeletal: Normal range of motion.  Cardiovascular:     Rate and Rhythm: Normal rate.  Pulmonary:     Effort: Pulmonary effort is normal. No respiratory distress.  Musculoskeletal: Normal range of motion.  Neurological:     Mental Status: She is alert.  Psychiatric:        Behavior: Behavior normal.      ED Treatments / Results  Labs (all labs ordered are listed, but only abnormal results are displayed) Labs Reviewed  POC URINE PREG, ED    EKG None  Radiology Mr Mrv Head W Wo Contrast  Result Date: 07/22/2019 CLINICAL DATA:  Headache.  Blurred vision in the right eye. EXAM: MR VENOGRAM HEAD WITHOUT AND WITH CONTRAST TECHNIQUE:  Angiographic images of the intracranial venous structures were obtained using MRV technique without and with intravenous contrast. COMPARISON:  MR head without and with contrast 07/18/2019 FINDINGS: The dural sinuses are patent. Straight sinus deep cerebral veins are intact. Transverse sinuses are codominant. Cortical veins are unremarkable. IMPRESSION: Negative MR venogram of the head. Electronically Signed   By: San Morelle M.D.   On: 07/22/2019 18:16    Procedures Procedures (including critical care time)  Medications Ordered in ED Medications  gadobutrol (GADAVIST) 1 MMOL/ML injection 10 mL (10 mLs Intravenous Contrast Given 07/22/19 1750)     Initial Impression / Assessment and Plan / ED Course  I have reviewed the triage vital signs and the nursing notes.  Pertinent labs & imaging results that were available during my care of the patient were reviewed by me and considered in my medical decision making (see chart for details).  I paged [facility], spoke to Dr Gala Romney with [facility] who is covering for [facility].  Dr Eulas Post is not familiar with the patient or her exam, unsure of specifics of case or indication for MRV.  Suspects this is to evaluate for dural venous/sinus thrombus.  Given recommendation by other ophthalmologist Dr Eulas Post feels appropriate to go ahead and obtain MRV today in ER.    Plan to discharge if normal. F/u with ophthalmology/neuro and admit if MRV positive for thrombus.   1825: MRV negative. Discussed results with patient. Per Dr Eulas Post patient able to be discharged if negative imaging. Pt has appointment with ophthalmologist on Wednesday.  Final Clinical Impressions(s) / ED Diagnoses   Final diagnoses:  Chronic nonintractable headache, unspecified headache type    ED Discharge Orders    None       Arlean Hopping 07/22/19 1825    Isla Pence, MD 07/25/19 [phone]

## 2019-07-22 NOTE — Discharge Instructions (Addendum)
MRV of your head today was negative.  I spoke to Dr. Gala Romney who is the ophthalmologist covering today for [facility].  Since your MRV is negative, there is no further need for work-up here in the ER or admission.  At this time the cause of your symptoms is unclear.  Follow-up with your eye doctor appointment on Wednesday for further guidance and management.

## 2019-07-22 NOTE — ED Notes (Signed)
Patient transported to MRI 

## 2019-08-02 ENCOUNTER — Encounter: Payer: Self-pay | Admitting: Neurology

## 2019-08-03 ENCOUNTER — Inpatient Hospital Stay: Admission: RE | Admit: 2019-08-03 | Payer: Medicaid Other | Source: Ambulatory Visit

## 2019-08-03 ENCOUNTER — Other Ambulatory Visit: Payer: Medicaid Other

## 2019-08-07 NOTE — Progress Notes (Signed)
NEUROLOGY CONSULTATION NOTE  [name] MRN: [id number] DOB: [birth year]  Referring provider: Martinique DeMarco, OD Primary care provider: Lin Landsman, MD  Reason for consult:  papilledema  HISTORY OF PRESENT ILLNESS: Margaret Caldwell is a 28 year old female with type 2 diabetes mellitus and history of right-sided Bell's palsy (2012) who presents for headache and papilledema.  History supplemented by referring provider note.  MRI and MRV of brain personally reviewed.  She was evaluated by optometrist, Dr. Parke Simmers, on 07/18/2019 for a routine exam.  She denied any symptoms.  Exam revealed bilateral optic disc edema.  She was sent to the ED for STAT MRI.  MRI of brain with and without contrast was unremarkable.  She subsequently developed a headache.  She was seen in the ED on 07/22/2019 for further evaluation.  MRV of head with and without contrast was negative for thrombosis.  She was re-examined on 07/25/2019 which did not demonstrate an enlarged blind spot.  She has a persistent daily non-throbbing headache in the temples and behind the eyes, sometimes bilateral or unilateral.  It is not positional.  She denies visual obscurations and pulsatile tinnitus.  She denies recent weight gain. She does not take birth control.    PAST MEDICAL HISTORY: Past Medical History:  Diagnosis Date  . Diabetes mellitus without complication ([place])     PAST SURGICAL HISTORY: History reviewed. No pertinent surgical history.  MEDICATIONS: Current Outpatient Medications on File Prior to Visit  Medication Sig Dispense Refill  . Prenatal Vit-Fe Fumarate-FA (MULTIVITAMIN-PRENATAL) 27-0.8 MG TABS tablet Take 1 tablet by mouth daily at 12 noon. 100 each 3  . ibuprofen (ADVIL,MOTRIN) 600 MG tablet Take 1 tablet (600 mg total) by mouth every 6 (six) hours as needed. (Patient not taking: Reported on 08/08/2019) 45 tablet 1   No current facility-administered medications on file prior to visit.     ALLERGIES: No Known  Allergies  FAMILY HISTORY: Family History  Problem Relation Age of Onset  . Diabetes Mother    SOCIAL HISTORY: Social History   Socioeconomic History  . Marital status: Married    Spouse name: Not on file  . Number of children: Not on file  . Years of education: Not on file  . Highest education level: Not on file  Occupational History  . Not on file  Social Needs  . Financial resource strain: Not on file  . Food insecurity    Worry: Not on file    Inability: Not on file  . Transportation needs    Medical: Not on file    Non-medical: Not on file  Tobacco Use  . Smoking status: Never Smoker  . Smokeless tobacco: Never Used  Substance and Sexual Activity  . Alcohol use: No    Frequency: Never  . Drug use: No  . Sexual activity: Yes  Lifestyle  . Physical activity    Days per week: 0 days    Minutes per session: 0 min  . Stress: Not at all  Relationships  . Social connections    Talks on phone: More than three times a week    Gets together: Twice a week    Attends religious service: More than 4 times per year    Active member of club or organization: No    Attends meetings of clubs or organizations: Never    Relationship status: Married  . Intimate partner violence    Fear of current or ex partner: No    Emotionally abused:  No    Physically abused: No    Forced sexual activity: No  Other Topics Concern  . Not on file  Social History Narrative   Leave with husband and 2 kids   1-story home without steps   Drink 1-cup coffee occasion   3 years college    REVIEW OF SYSTEMS: Constitutional: No fevers, chills, or sweats, no generalized fatigue, change in appetite Eyes: No visual changes, double vision, eye pain Ear, nose and throat: No hearing loss, ear pain, nasal congestion, sore throat Cardiovascular: No chest pain, palpitations Respiratory:  No shortness of breath at rest or with exertion, wheezes GastrointestinaI: No nausea, vomiting, diarrhea, abdominal  pain, fecal incontinence Genitourinary:  No dysuria, urinary retention or frequency Musculoskeletal:  No neck pain, back pain Integumentary: No rash, pruritus, skin lesions Neurological: as above Psychiatric: No depression, insomnia, anxiety Endocrine: No palpitations, fatigue, diaphoresis, mood swings, change in appetite, change in weight, increased thirst Hematologic/Lymphatic:  No purpura, petechiae. Allergic/Immunologic: no itchy/runny eyes, nasal congestion, recent allergic reactions, rashes  PHYSICAL EXAM: Blood pressure 123/77, pulse 82, temperature 98.4 F (36.9 C), resp. rate 12, height 5\' 4"  (1.626 m), weight 222 lb (100.7 kg), SpO2 99 %, unknown if currently breastfeeding. General: No acute distress.  Patient appears well-groomed.   Head:  Normocephalic/atraumatic Eyes:  fundi examined but not visualized Neck: supple, no paraspinal tenderness, full range of motion Back: No paraspinal tenderness Heart: regular rate and rhythm Lungs: Clear to auscultation bilaterally. Vascular: No carotid bruits. Neurological Exam: Mental status: alert and oriented to person, place, and time, recent and remote memory intact, fund of knowledge intact, attention and concentration intact, speech fluent and not dysarthric, language intact. Cranial nerves: CN I: not tested CN II: pupils equal, round and reactive to light, visual fields intact CN III, IV, VI:  full range of motion, no nystagmus, no ptosis CN V: facial sensation intact CN VII: upper and lower face symmetric CN VIII: hearing intact CN IX, X: gag intact, uvula midline CN XI: sternocleidomastoid and trapezius muscles intact CN XII: tongue midline Bulk & Tone: normal, no fasciculations. Motor:  5/5 throughout  Sensation:  temperature and vibration sensation intact. Deep Tendon Reflexes:  2+ throughout, toes downgoing.   Finger to nose testing:  Without dysmetria.  Heel to shin:  Without dysmetria.   Gait:  Normal station and  stride. Romberg negative.  IMPRESSION: Papilledema with headache.  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  Schedule LP to evaluate opening pressure and CSF cell count, protein, glucose, and gram stain and culture. 2.  Following LP, will initiate acetazolamide 500mg  twice daily. 3.  She should follow up with Dr. about 6 to 8 weeks after starting acetazolamide. 4.  Discussed importance of weight loss 5.  Follow up with me in 4 months.   Thank you for allowing me to take part in the care of this patient.  , DO  CC:  Krista Blue DeMarco, OD  Shon Millet, MD

## 2019-08-08 ENCOUNTER — Encounter: Payer: Self-pay | Admitting: Neurology

## 2019-08-08 ENCOUNTER — Other Ambulatory Visit: Payer: Self-pay

## 2019-08-08 ENCOUNTER — Ambulatory Visit (INDEPENDENT_AMBULATORY_CARE_PROVIDER_SITE_OTHER): Payer: Medicaid Other | Admitting: Neurology

## 2019-08-08 VITALS — BP 123/77 | HR 82 | Temp 98.4°F | Resp 12 | Ht 64.0 in | Wt 222.0 lb

## 2019-08-08 DIAGNOSIS — G932 Benign intracranial hypertension: Secondary | ICD-10-CM

## 2019-08-08 NOTE — Patient Instructions (Signed)
1.  I will first schedule you for a lumbar puncture (or spinal tap) which is performed at [facility].  We will check cell count, protein, glucose, and gram stain and culture.   2.  After the spinal tap, I will start you on a medication to reduce pressure in your head 3.  Follow up in 4 months.   Idiopathic Intracranial Hypertension  Idiopathic intracranial hypertension (IIH) is a condition that increases pressure around the brain. The fluid that surrounds the brain and spinal cord (cerebrospinal fluid, CSF) increases and causes the pressure. Idiopathic means that the cause of this condition is not known. IIH affects the brain and spinal cord (is a neurological disorder). If this condition is not treated, it can cause vision loss or blindness. What increases the risk? You are more likely to develop this condition if:  You are severely overweight (obese).  You are a woman who has not gone through menopause.  You take certain medicines, such as birth control or steroids. What are the signs or symptoms? Symptoms of IIH include:  Headaches. This is the most common symptom.  Pain in the shoulders or neck.  Nausea and vomiting.  A "rushing water" or pulsing sound within the ears (pulsatile tinnitus).  Double vision.  Blurred vision.  Brief episodes of complete vision loss. How is this diagnosed? This condition may be diagnosed based on:  Your symptoms.  Your medical history.  CT scan of the brain.  MRI of the brain.  Magnetic resonance venogram (MRV) to check veins in the brain.  Diagnostic lumbar puncture. This is a procedure to remove and examine a sample of cerebrospinal fluid. This procedure can determine whether too much fluid may be causing IIH.  A thorough eye exam to check for swelling or nerve damage in the eyes. How is this treated? Treatment for this condition depends on your symptoms. The goal of treatment is to decrease the pressure around your brain.  Common treatments include:  Medicines to decrease the production of spinal fluid and lower the pressure within your skull.  Medicines to prevent or treat headaches.  Surgery to place drains (shunts) in your brain to remove excess fluid.  Lumbar puncture to remove excess cerebrospinal fluid. Follow these instructions at home:  If you are overweight or obese, work with your health care provider to lose weight.  Take over-the-counter and prescription medicines only as told by your health care provider.  Do not drive or use heavy machinery while taking medicines that can make you sleepy.  Keep all follow-up visits as told by your health care provider. This is important. Contact a health care provider if:  You have changes in your vision, such as: ? Double vision. ? Not being able to see colors (color vision). Get help right away if:  You have any of the following symptoms and they get worse or do not get better. ? Headaches. ? Nausea. ? Vomiting. ? Vision changes or difficulty seeing. Summary  Idiopathic intracranial hypertension (IIH) is a condition that increases pressure around the brain. The cause is not known (is idiopathic).  The most common symptom of IIH is headaches.  Treatment may include medicines or surgery to relieve the pressure on your brain. This information is not intended to replace advice given to you by your health care provider. Make sure you discuss any questions you have with your health care provider. Document Released: 01/03/2002 Document Revised: 10/07/2017 Document Reviewed: 09/15/2016 Elsevier Patient Education  2020 [employer].

## 2019-08-09 ENCOUNTER — Telehealth: Payer: Self-pay | Admitting: Neurology

## 2019-08-09 NOTE — Telephone Encounter (Signed)
Patient needs to speak to someone about a new symptom  She is having She states that it started today and it is a sound in  Her ear

## 2019-08-09 NOTE — Telephone Encounter (Signed)
Called pt--stated symptoms both ear swiss noise when laying down start 1 hour ago. Pt  denied  Fever. Please advise

## 2019-08-10 NOTE — Telephone Encounter (Signed)
Noted.  That is one of the symptoms of increased pressure in the head.  Not unexpected.

## 2019-08-14 ENCOUNTER — Other Ambulatory Visit: Payer: Self-pay

## 2019-08-14 ENCOUNTER — Encounter: Payer: Medicaid Other | Attending: Family Medicine | Admitting: Registered"

## 2019-08-14 ENCOUNTER — Encounter

## 2019-08-14 ENCOUNTER — Encounter: Payer: Self-pay | Admitting: Registered"

## 2019-08-14 DIAGNOSIS — F50819 Binge eating disorder, unspecified: Secondary | ICD-10-CM

## 2019-08-14 DIAGNOSIS — F5081 Binge eating disorder: Secondary | ICD-10-CM | POA: Diagnosis not present

## 2019-08-14 NOTE — Progress Notes (Signed)
Appointment start time: 3:08  Appointment end time: 4:10  Patient was seen on 08/14/2019 for nutrition counseling pertaining to disordered eating  Primary care provider: Lin Landsman, MD Therapist: N/A  ROI: N/A Any other medical team members: none   Pronounced "Ida" Assessment  Pt arrives stating she can't control her food, wants to lose weight, and wants a diet plan. Reports she eats a lot especially when making Eastern desserts. States desserts contain a lot of flour, sugar, butter, peanuts, and coconut. States she does not typically eat lunch for cultural reasons, normally eats 2 meals a day and sometimes snacks midday.   Feels like when having desserts she is no longer in control. "I can't stop myself". Does not drink soda.   Has 2 children (ages 69 and 28).   Eating history: Length of time: since childhood Previous treatments: none Goals for RD meetings: normalize relationship with food  Weight history:  Highest weight: 222   Lowest weight: 216 Most consistent weight:   What would you like to weigh: 175 How has weight changed in the past year: unchanged  Medical Information:  Changes in hair, skin, nails since ED started: hair is breaking off Chewing/swallowing difficulties: no Reflux or heartburn: no Trouble with teeth: yes, tooth hurts when eating LMP without the use of hormones: 9/1  Weight at that point: 222 Constipation, diarrhea: no, BM every 1-2 days Dizziness/lightheadedness: no Headaches/body aches: yes Heart racing/chest pain: no Mood: sometimes angry and then feels normal Sleep: well, sleeps about 7 hrs Focus/concentration: sometimes Cold intolerance: no Vision changes: no  Mental health diagnosis: BED   Dietary assessment: A typical day consists of 2 meals and 2-3 snacks  Safe foods include:  Avoided foods include:  24 hour recall:  B: 1 pancake  S: 6-7 tea cookies L: does not eat lunch at home, sometimes will eat fruit, cookies, cake,  sandwich S: sometimes [place] dessert D: lamb + salad + okra   S: none Beverages: milk + tea (3 cups), water (16 oz/day),   What Methods Do You Use To Control Your Weight (Compensatory behaviors)?           Binge  Estimated energy intake: ~1300+ kcal  Estimated energy needs: 2000 kcal 225 g CHO 150 g pro 56 g fat  Nutrition Diagnosis: NB-1.5 Disordered eating pattern As related to binge eating disorder.  As evidenced by reports of binge eating.  Intervention/Goals: Pt was educated and counseled on eating disorder treatment team and binge eating disorder including s/s.   Meal plan:    3 meals    0 snacks  Monitoring and Evaluation: Patient will follow up in 3 weeks.

## 2019-08-16 ENCOUNTER — Telehealth: Payer: Self-pay | Admitting: Neurology

## 2019-08-16 NOTE — Telephone Encounter (Signed)
Patient is wanting to speak with the nurse. She said something about a spinal tap. I was unable to understand. Thanks!

## 2019-08-17 NOTE — Telephone Encounter (Signed)
Called patient back and not there - voice mail full unable to leave message.

## 2019-09-04 ENCOUNTER — Ambulatory Visit: Payer: Medicaid Other | Admitting: Registered"

## 2019-09-18 ENCOUNTER — Telehealth: Payer: Self-pay | Admitting: Neurology

## 2019-09-18 DIAGNOSIS — G932 Benign intracranial hypertension: Secondary | ICD-10-CM

## 2019-09-18 NOTE — Telephone Encounter (Signed)
Levada Dy called from [facility] regarding this patient and Doctor Jaffe's last office note. He mentioned Scheduling patient for an LP to open Pressure? They are wanting to know who will be scheduling that appointment and Where it will be performed? Please Call. Thank you

## 2019-09-18 NOTE — Telephone Encounter (Signed)
Patient Instructions by Pieter Partridge, DO at 08/08/2019 12:50 PM Author: Pieter Partridge, DO Author Type: Physician Filed: 08/08/2019 1:40 PM  Note Status: Signed Cosign: Cosign Not Required Encounter Date: 08/08/2019  Editor: Pieter Partridge, DO (Physician)    1.  I will first schedule you for a lumbar puncture (or spinal tap) which is performed at [facility].  We will check cell count, protein, glucose, and gram stain and culture.   2.  After the spinal tap, I will start you on a medication to reduce pressure in your head 3.  Follow up in 4 months.

## 2019-09-18 NOTE — Telephone Encounter (Signed)
Orders was never placed Will place order at [facility] for sooner appt. Called Levada Dy at [facility] no answer left this on voice mail

## 2019-09-20 ENCOUNTER — Telehealth: Payer: Self-pay | Admitting: Neurology

## 2019-09-20 NOTE — Telephone Encounter (Signed)
Patient is calling in about spinal tap testing. She is wanting to know about that she hasn't heard anything. It's been 1 month. She said she has a bad headache. Thanks!

## 2019-09-20 NOTE — Telephone Encounter (Signed)
Spoke with patient she was informed that orders was never placed. Order was placed at [facility] she was given number to call to schedule appt if she has not heard from them in 2-3 days.

## 2019-09-21 ENCOUNTER — Telehealth: Payer: Self-pay | Admitting: Neurology

## 2019-09-21 NOTE — Telephone Encounter (Signed)
The following message was left with [facility] on 09/21/19 at 11:57 AM.  Caller states she needs to speak with the nurse in the office.

## 2019-09-21 NOTE — Telephone Encounter (Signed)
Called spoke with patient she had questions about lumbar puncture. I conference patient in with scheduling. She will be schedule at [facility]

## 2019-09-26 ENCOUNTER — Ambulatory Visit (HOSPITAL_COMMUNITY)
Admission: RE | Admit: 2019-09-26 | Discharge: 2019-09-26 | Disposition: A | Discharge status: Home / Self Care | Payer: Medicaid Other | Source: Ambulatory Visit | Attending: Neurology | Admitting: Neurology

## 2019-09-26 ENCOUNTER — Other Ambulatory Visit: Payer: Self-pay

## 2019-09-26 DIAGNOSIS — G932 Benign intracranial hypertension: Secondary | ICD-10-CM | POA: Diagnosis not present

## 2019-09-26 LAB — CSF CELL COUNT WITH DIFFERENTIAL
RBC Count, CSF: 0 /mm3
Tube #: 3
WBC, CSF: 2 /mm3 (ref 0–5)

## 2019-09-26 LAB — PROTEIN, CSF: Total  Protein, CSF: 15 mg/dL (ref 15–45)

## 2019-09-26 LAB — GLUCOSE, CSF: Glucose, CSF: 63 mg/dL (ref 40–70)

## 2019-09-26 MED ORDER — LIDOCAINE HCL (PF) 1 % IJ SOLN
5.0000 mL | Freq: Once | INTRAMUSCULAR | Status: AC
  Administered 2019-09-26: 10:00:00 5 mL via INTRADERMAL

## 2019-09-26 NOTE — Procedures (Signed)
Successful fluoro-guided L3-L4 lumbar puncture without immediate post-procedure complication. Opening pressure 34.5 cm water. 10 mL CSF sent to lab for studies. Closing pressure 21.5 cm water.

## 2019-09-26 NOTE — Discharge Instructions (Signed)
Lumbar Puncture, Care After °This sheet gives you information about how to care for yourself after your procedure. Your health care provider may also give you more specific instructions. If you have problems or questions, contact your health care provider. °What can I expect after the procedure? °After the procedure, it is common to have: °· Mild discomfort or pain at the puncture site. °· A mild headache that is relieved with pain medicines. °Follow these instructions at home: °Activity ° °· Lie down flat or rest for as long as directed by your health care provider. °· Return to your normal activities as told by your health care provider. Ask your health care provider what activities are safe for you. °· Avoid lifting anything heavier than 10 lb (4.5 kg) for at least 12 hours after the procedure. °· Do not drive for 24 hours if you were given a medicine to help you relax (sedative) during your procedure. °· Do not drive or use heavy machinery while taking prescription pain medicine. °Puncture site care °· Remove or change your bandage (dressing) as told by your health care provider. °· Check your puncture area every day for signs of infection. Check for: °? More pain. °? Redness or swelling. °? Fluid or blood leaking from the puncture site. °? Warmth. °? Pus or a bad smell. °General instructions °· Take over-the-counter and prescription medicines only as told by your health care provider. °· Drink enough fluids to keep your urine clear or pale yellow. Your health care provider may recommend drinking caffeine to prevent a headache. °· Keep all follow-up visits as told by your health care provider. This is important. °Contact a health care provider if: °· You have fever or chills. °· You have nausea or vomiting. °· You have a headache that lasts for more than 2 days or does not get better with medicine. °Get help right away if: °· You develop any of the following in your  legs: °? Weakness. °? Numbness. °? Tingling. °· You are unable to control when you urinate or have a bowel movement (incontinence). °· You have signs of infection around your puncture site, such as: °? More pain. °? Redness or swelling. °? Fluid or blood leakage. °? Warmth. °? Pus or a bad smell. °· You are dizzy or you feel like you might faint. °· You have a severe headache, especially when you sit or stand. °Summary °· A lumbar puncture is a procedure in which a small needle is inserted into the lower back to remove fluid that surrounds the brain and spinal cord. °· After this procedure, it is common to have a headache and pain around the needle insertion area. °· Lying flat, staying hydrated, and drinking caffeine can help prevent headaches. °· Monitor your needle insertion site for signs of infection, including warmth, fluid, or more pain. °· Get help right away if you develop leg weakness, leg numbness, incontinence, or severe headaches. °This information is not intended to replace advice given to you by your health care provider. Make sure you discuss any questions you have with your health care provider. °Document Released: 10/30/2013 Document Revised: 12/08/2016 Document Reviewed: 12/08/2016 °Elsevier Patient Education © 2020 Elsevier Inc. ° °

## 2019-09-26 NOTE — Progress Notes (Signed)
Patient discharged via wheelchair.  No complaints.  Instructed patient and husband for patient to drinks liquids and lay flat tonight.

## 2019-09-27 ENCOUNTER — Telehealth: Payer: Self-pay

## 2019-09-27 MED ORDER — ACETAZOLAMIDE ER 500 MG PO CP12: 500.0000 mg | ORAL_CAPSULE | Freq: Two times a day (BID) | ORAL | 5 refills | Status: DC

## 2019-09-27 NOTE — Telephone Encounter (Signed)
-----   Message from Pieter Partridge, DO sent at 09/27/2019  6:49 AM EST ----- Spinal tap reveals elevated pressure.  Start acetazolamide 500mg  twice daily.  She should be advised that side effects may include tingling sensation, so not to be worried IF she experiences this.  She should follow up with Dr. Parke Simmers in 6 to 8 weeks for re-evaluation of eyes and to have his note sent to me.

## 2019-09-27 NOTE — Telephone Encounter (Signed)
Called patient she was informed of results and understands.  Pt has follow up with Dr. Parke Simmers in 12/2019  Rx sent to pharmacy patient verified

## 2019-09-29 LAB — CSF CULTURE W GRAM STAIN
Culture: NO GROWTH
Gram Stain: NONE SEEN

## 2019-11-09 NOTE — L&D Delivery Note (Addendum)
Delivery Note Pt received epidural at about 500pm but then rapidly progressed to complete and never really got comfortable.  She was involuntarily pushing when I arrived and so we prepared quickly for delivery.  There was moderate meconium stained fluid noted. Pt pushed about 5 minutes and at 5:57 PM a healthy female was delivered via Vaginal, Spontaneous (Presentation: Left Occiput Anterior).  APGAR: 9, 10; weight pending .   Placenta status: Spontaneous, Intact.  Cord: 3 vessels with the following complications: Short.  Anesthesia: Epidural Episiotomy: None Lacerations: 2nd degree Suture Repair: 3.0 vicryl rapide Est. Blood Loss (mL): 125  Mom to postpartum.  Baby to Couplet care / Skin to Skin.  BP have been elevated in labor but PI[facility] labs WNL and prot:creat ration collected to send at delivery.  Will follow to see if persist now that pain is improving.  Margaret Caldwell 09/29/2020, 6:21 PM

## 2019-11-28 ENCOUNTER — Telehealth: Payer: Self-pay | Admitting: Neurology

## 2019-11-28 NOTE — Telephone Encounter (Signed)
It called medication is not helping her headache she has had it for a few days not sure what to do? Needs something to help

## 2019-11-28 NOTE — Telephone Encounter (Signed)
Patient called this morning regarding her having a headache on the Right Side and not getting better. She said that she has taken her medication. She uses Psychologist, forensic on [address]. Please Call. Thank you

## 2019-11-29 NOTE — Telephone Encounter (Signed)
Did she follow up with Dr. Krista Blue for a repeat eye exam.  She was supposed to follow up with him around this time to see if there is still evidence of increased pressure.  She should really follow up with him ASAP.  I need to know if the headache is related to that or may be just a headache.    If she has a driver, she may come in for a headache cocktail.  Otherwise, I can prescribe her something in case that this may be a migraine.  We can prescribe her rizatriptan 10mg .  May repeat 1 tablet after 2 hours if needed.  Maximum 2 tablets in 24 hours.  Should not take more than 2 days out of the week.

## 2019-11-29 NOTE — Telephone Encounter (Signed)
Voicemail at [phone]

## 2019-11-29 NOTE — Telephone Encounter (Signed)
No answer at [phone]

## 2019-11-30 NOTE — Telephone Encounter (Signed)
Letter mailed today due to multiple attempts, no answer

## 2019-11-30 NOTE — Telephone Encounter (Signed)
No answer again at [phone] on 11/30/2019

## 2019-11-30 NOTE — Telephone Encounter (Signed)
No answer at [facility] 11/30/2019

## 2019-11-30 NOTE — Telephone Encounter (Signed)
Please see, I have not been able to reach patient of the above orders per Dr.Jaffe. Multiple attempts, hopefully patient will call back.

## 2019-11-30 NOTE — Telephone Encounter (Signed)
Pls mail a letter as well that we have been trying to contact her and to contact our office and her eye doctor. Thanks

## 2019-12-10 ENCOUNTER — Encounter: Payer: Self-pay | Admitting: Neurology

## 2019-12-10 NOTE — Progress Notes (Signed)
Virtual Visit via Video Note The purpose of this virtual visit is to provide medical care while limiting exposure to the novel coronavirus.    Consent was obtained for video visit:  Yes.   Answered questions that patient had about telehealth interaction:  Yes.   I discussed the limitations, risks, security and privacy concerns of performing an evaluation and management service by telemedicine. I also discussed with the patient that there may be a patient responsible charge related to this service. The patient expressed understanding and agreed to proceed.  Pt location: Home Physician Location: office Name of referring provider:  Lin Landsman, MD I connected with Crawford Givens at patients initiation/request on 12/12/2019 at 10:10 AM EST by video enabled telemedicine application and verified that I am speaking with the correct person using two identifiers. Pt MRN:  [id number] Pt DOB:  [birth year] Video Participants:  Crawford Givens   History of Present Illness:  Margaret Caldwell is a 29 year old female with type 2 diabetes mellitus and history of right-sided Bell's palsy (2012) who follows up for idiopathic intracranial hypertension.  UPDATE: She underwent LP on 09/26/2019, which demonstrated an elevated opening pressure of 34.5 cm of water and closing pressure of 21.5 cm of water.  She was started on acetazolamide 500mg  twice daily.  She started experiencing a tooth ache that was causing a headache.  She saw the dentist who found a cavity and she underwent a filling.  Headache has improved.  She is no longer having the dull persistent headaches either.  She is following up with Dr. Parke Simmers tomorrow for repeat exam.  HISTORY: She was evaluated by optometrist, Dr. Parke Simmers, on 07/18/2019 for a routine exam.  She denied any symptoms.  Exam revealed bilateral optic disc edema.  She was sent to the ED for STAT MRI.  MRI of brain with and without contrast was unremarkable.  She subsequently  developed a headache.  She was seen in the ED on 07/22/2019 for further evaluation.  MRV of head with and without contrast was negative for thrombosis.  She was re-examined on 07/25/2019 which did not demonstrate an enlarged blind spot.  She has a persistent daily non-throbbing headache in the temples and behind the eyes, sometimes bilateral or unilateral.  It is not positional.  She denies visual obscurations and pulsatile tinnitus.  She denies recent weight gain. She does not take birth control.    Past Medical History: No past medical history on file.  Medications: Outpatient Encounter Medications as of 12/12/2019  Medication Sig  . acetaZOLAMIDE (DIAMOX) 500 MG capsule Take 1 capsule (500 mg total) by mouth 2 (two) times daily.  Marland Kitchen ibuprofen (ADVIL,MOTRIN) 600 MG tablet Take 1 tablet (600 mg total) by mouth every 6 (six) hours as needed. (Patient not taking: Reported on 08/08/2019)  . Prenatal Vit-Fe Fumarate-FA (MULTIVITAMIN-PRENATAL) 27-0.8 MG TABS tablet Take 1 tablet by mouth daily at 12 noon. (Patient not taking: Reported on 08/14/2019)   No facility-administered encounter medications on file as of 12/12/2019.    Allergies: No Known Allergies  Family History: Family History  Problem Relation Age of Onset  . Diabetes Mother     Social History: Social History   Socioeconomic History  . Marital status: Married    Spouse name: Not on file  . Number of children: Not on file  . Years of education: Not on file  . Highest education level: Not on file  Occupational History  . Not on file  Tobacco Use  . Smoking status: Never Smoker  . Smokeless tobacco: Never Used  Substance and Sexual Activity  . Alcohol use: No  . Drug use: No  . Sexual activity: Yes  Other Topics Concern  . Not on file  Social History Narrative   Leave with husband and 2 kids   1-story home without steps   Drink 1-cup coffee occasion   3 years college   Right handed   Social Determinants of Health    Financial Resource Strain:   . Difficulty of Paying Living Expenses: Not on file  Food Insecurity:   . Worried About Programme researcher, broadcasting/film/video in the Last Year: Not on file  . Ran Out of Food in the Last Year: Not on file  Transportation Needs:   . Lack of Transportation (Medical): Not on file  . Lack of Transportation (Non-Medical): Not on file  Physical Activity:   . Days of Exercise per Week: Not on file  . Minutes of Exercise per Session: Not on file  Stress:   . Feeling of Stress : Not on file  Social Connections:   . Frequency of Communication with Friends and Family: Not on file  . Frequency of Social Gatherings with Friends and Family: Not on file  . Attends Religious Services: Not on file  . Active Member of Clubs or Organizations: Not on file  . Attends Banker Meetings: Not on file  . Marital Status: Not on file  Intimate Partner Violence:   . Fear of Current or Ex-Partner: Not on file  . Emotionally Abused: Not on file  . Physically Abused: Not on file  . Sexually Abused: Not on file    Observations/Objective:   Height 5\' 2"  (1.575 m), weight 197 lb (89.4 kg), unknown if currently breastfeeding. No acute distress.  Alert and oriented.  Speech fluent and not dysarthric.  Language intact.  Eyes orthophoric on primary gaze.  Face symmetric.  Assessment and Plan:   Idiopathic intracranial hypertension  1.  Acetazolamide 500mg  twice daily. 2.  Follow up eye exam tomorrow.  Will make medication adjustments if needed. 3.  Follow up in 4 months.  Follow Up Instructions:    -I discussed the assessment and treatment plan with the patient. The patient was provided an opportunity to ask questions and all were answered. The patient agreed with the plan and demonstrated an understanding of the instructions.   The patient was advised to call back or seek an in-person evaluation if the symptoms worsen or if the condition fails to improve as anticipated.    , DO

## 2019-12-12 ENCOUNTER — Telehealth (INDEPENDENT_AMBULATORY_CARE_PROVIDER_SITE_OTHER): Payer: Medicaid Other | Admitting: Neurology

## 2019-12-12 ENCOUNTER — Other Ambulatory Visit: Payer: Self-pay

## 2019-12-12 ENCOUNTER — Encounter: Payer: Self-pay | Admitting: Neurology

## 2019-12-12 VITALS — Ht 62.0 in | Wt 197.0 lb

## 2019-12-12 DIAGNOSIS — G932 Benign intracranial hypertension: Secondary | ICD-10-CM

## 2019-12-17 ENCOUNTER — Telehealth: Payer: Self-pay | Admitting: Neurology

## 2019-12-17 NOTE — Telephone Encounter (Signed)
Marylene Land called needing to have this patient's last office note faxed to [phone]. Thank you

## 2019-12-17 NOTE — Telephone Encounter (Signed)
Printed last OV from 12/12/19 and faxed to [phone].

## 2020-02-06 ENCOUNTER — Telehealth: Payer: Self-pay

## 2020-02-06 NOTE — Telephone Encounter (Signed)
Pt states [redacted] wks pregnant & wants to know if it is ok to continue taking BP meds prescribed by Everlena Cooper or if she should stop.

## 2020-02-06 NOTE — Telephone Encounter (Signed)
Advised patient Dr Everlena Cooper was not in the office and she should contact the OB/GYN about medication.  She is going to consult her pharmacist.

## 2020-02-22 ENCOUNTER — Telehealth: Payer: Self-pay | Admitting: Neurology

## 2020-02-22 NOTE — Telephone Encounter (Signed)
Spoke with Dr Everlena Cooper and he stated this should be fine but the patient should follow up with her OB. Patient notified and voiced understanding.

## 2020-02-22 NOTE — Telephone Encounter (Signed)
Patient called to be sure she is okay to continue to take her acetazolamide 500 MG, she is now pregnant.

## 2020-03-04 LAB — OB RESULTS CONSOLE RPR: RPR: NONREACTIVE

## 2020-03-04 LAB — OB RESULTS CONSOLE HEPATITIS B SURFACE ANTIGEN: Hepatitis B Surface Ag: NEGATIVE

## 2020-03-04 LAB — OB RESULTS CONSOLE ABO/RH: RH Type: POSITIVE

## 2020-03-04 LAB — OB RESULTS CONSOLE GC/CHLAMYDIA
Chlamydia: NEGATIVE
Gonorrhea: NEGATIVE

## 2020-03-04 LAB — OB RESULTS CONSOLE ANTIBODY SCREEN: Antibody Screen: NEGATIVE

## 2020-03-04 LAB — OB RESULTS CONSOLE RUBELLA ANTIBODY, IGM: Rubella: IMMUNE

## 2020-03-04 LAB — OB RESULTS CONSOLE HIV ANTIBODY (ROUTINE TESTING): HIV: NONREACTIVE

## 2020-04-10 NOTE — Progress Notes (Deleted)
NEUROLOGY FOLLOW UP OFFICE NOTE  Margaret Caldwell [id number]  HISTORY OF PRESENT ILLNESS: Margaret Caldwell is a 29 year old female with type 2 diabetes mellitus and history ofright-sidedBell's palsy (2012)who follows up for idiopathic intracranial hypertension  UPDATE: She followed up with Dr. Krista Blue at [facility] on 12/17/2019 which demonstrated significant improvement.  However, she found out that she was [redacted] weeks pregnant in late March.  ***  HISTORY: She was evaluated by optometrist, Dr. Krista Blue, on 9/9/2020for a routine exam. She denied any symptoms. Exam revealed bilateral optic disc edema. She was sent to the ED for STATMRI. MRI of brain with and without contrast was unremarkable. She subsequently developed a headache. She was seen in the ED on 07/22/2019 for further evaluation. MRV of head with and without contrast was negative for thrombosis. She was re-examined on 07/25/2019 which did not demonstrate an enlarged blind spot. She has a persistent daily non-throbbing headache in the temples and behind the eyes, sometimes bilateral or unilateral. It is not positional. She denies visual obscurations and pulsatile tinnitus. She denies recent weight gain. She does not take birth control. She underwent LP on 09/26/2019, which demonstrated an elevated opening pressure of 34.5 cm of water and closing pressure of 21.5 cm of water.  She was started on acetazolamide 500mg  twice daily.  She started experiencing a tooth ache that was causing a headache.  She saw the dentist who found a cavity and she underwent a filling.  PAST MEDICAL HISTORY: No past medical history on file.  MEDICATIONS: Current Outpatient Medications on File Prior to Visit  Medication Sig Dispense Refill  . acetaZOLAMIDE (DIAMOX) 500 MG capsule Take 1 capsule (500 mg total) by mouth 2 (two) times daily. 60 capsule 5  . ibuprofen (ADVIL,MOTRIN) 600 MG tablet Take 1 tablet (600 mg total) by mouth every 6  (six) hours as needed. (Patient not taking: Reported on 08/08/2019) 45 tablet 1  . Prenatal Vit-Fe Fumarate-FA (MULTIVITAMIN-PRENATAL) 27-0.8 MG TABS tablet Take 1 tablet by mouth daily at 12 noon. (Patient not taking: Reported on 08/14/2019) 100 each 3   No current facility-administered medications on file prior to visit.    ALLERGIES: No Known Allergies  FAMILY HISTORY: Family History  Problem Relation Age of Onset  . Diabetes Mother     SOCIAL HISTORY: Social History   Socioeconomic History  . Marital status: Married    Spouse name: Not on file  . Number of children: Not on file  . Years of education: Not on file  . Highest education level: Not on file  Occupational History  . Not on file  Tobacco Use  . Smoking status: Never Smoker  . Smokeless tobacco: Never Used  Substance and Sexual Activity  . Alcohol use: No  . Drug use: No  . Sexual activity: Yes  Other Topics Concern  . Not on file  Social History Narrative   Leave with husband and 2 kids   1-story home without steps   Drink 1-cup coffee occasion   3 years college   Right handed   Social Determinants of Health   Financial Resource Strain:   . Difficulty of Paying Living Expenses:   Food Insecurity:   . Worried About 10/14/2019 in the Last Year:   . Programme researcher, broadcasting/film/video in the Last Year:   Transportation Needs:   . Barista (Medical):   Freight forwarder Lack of Transportation (Non-Medical):   Physical Activity:   . Days  of Exercise per Week:   . Minutes of Exercise per Session:   Stress:   . Feeling of Stress :   Social Connections:   . Frequency of Communication with Friends and Family:   . Frequency of Social Gatherings with Friends and Family:   . Attends Religious Services:   . Active Member of Clubs or Organizations:   . Attends Archivist Meetings:   Marland Kitchen Marital Status:   Intimate Partner Violence:   . Fear of Current or Ex-Partner:   . Emotionally Abused:   Marland Kitchen Physically  Abused:   . Sexually Abused:     PHYSICAL EXAM: *** General: No acute distress.  Patient appears well-groomed.   Head:  Normocephalic/atraumatic Eyes:  Fundi examined but not visualized Neck: supple, no paraspinal tenderness, full range of motion Heart:  Regular rate and rhythm Lungs:  Clear to auscultation bilaterally Back: No paraspinal tenderness Neurological Exam: alert and oriented to person, place, and time. Attention span and concentration intact, recent and remote memory intact, fund of knowledge intact.  Speech fluent and not dysarthric, language intact.  CN II-XII intact. Bulk and tone normal, muscle strength 5/5 throughout.  Sensation to light touch, temperature and vibration intact.  Deep tendon reflexes 2+ throughout, toes downgoing.  Finger to nose and heel to shin testing intact.  Gait normal, Romberg negative.  IMPRESSION: 1.  Idiopathic intracranial hypertension 2.  ***  PLAN: ***  Metta Clines, DO  CC: Lin Landsman, MD

## 2020-04-14 ENCOUNTER — Ambulatory Visit: Payer: Medicaid Other | Admitting: Neurology

## 2020-04-29 ENCOUNTER — Telehealth: Payer: Self-pay | Admitting: Neurology

## 2020-04-29 NOTE — Telephone Encounter (Signed)
Please advised  

## 2020-04-29 NOTE — Telephone Encounter (Signed)
Patient called in and needs a letter from Dr. Everlena Cooper. She is trying to get her mother to be able to come into the country to help take care of her with her medical issues. She is not able to do so without a letter describing what all is going on medically with the patient. The patient would like to pick up the letter in the office once its ready.

## 2020-04-30 NOTE — Telephone Encounter (Signed)
I need more clarification what this letter is for and what it needs to explain.  I see her for idiopathic intracranial hypertension, which is not a disability or would have anything to do with her mother being able to come into the country.

## 2020-04-30 NOTE — Telephone Encounter (Signed)
Interperter: Amr # [id number]  Advised pt of note from Dr. Everlena Cooper. PT verbalized understanding.

## 2020-07-07 NOTE — Progress Notes (Signed)
NEUROLOGY FOLLOW UP OFFICE NOTE  Margaret Caldwell [id number]  HISTORY OF PRESENT ILLNESS: Margaret Caldwell is a 29 year old female with type 2 diabetes mellitus and history ofright-sidedBell's palsy (2012)who follows up for idiopathic intracranial hypertension.  UPDATE: She was started on acetazolamide 500mg  twice daily.  She found out she was pregnant 6 months ago and was taken off of acetazolamide by her obstetrician, Dr. .  No headaches or change in vision.  No recent eye exams.  She is due in November.  HISTORY: She was evaluated by optometrist, Dr. December, on 9/9/2020for a routine exam. She denied any symptoms. Exam revealed bilateral optic disc edema. She was sent to the ED for STATMRI. MRI of brain with and without contrast was unremarkable. She subsequently developed a headache. She was seen in the ED on 07/22/2019 for further evaluation. MRV of head with and without contrast was negative for thrombosis. She was re-examined on 07/25/2019 which did not demonstrate an enlarged blind spot. She underwent LP on 09/26/2019, which demonstrated an elevated opening pressure of 34.5 cm of water and closing pressure of 21.5 cm of water.  She had a persistent daily non-throbbing headache in the temples and behind the eyes, sometimes bilateral or unilateral. It is not positional. She denies visual obscurations and pulsatile tinnitus. She denies recent weight gain. She does not take birth control.   PAST MEDICAL HISTORY: No past medical history on file.  MEDICATIONS: Current Outpatient Medications on File Prior to Visit  Medication Sig Dispense Refill  . acetaZOLAMIDE (DIAMOX) 500 MG capsule Take 1 capsule (500 mg total) by mouth 2 (two) times daily. 60 capsule 5  . ibuprofen (ADVIL,MOTRIN) 600 MG tablet Take 1 tablet (600 mg total) by mouth every 6 (six) hours as needed. (Patient not taking: Reported on 08/08/2019) 45 tablet 1  . Prenatal Vit-Fe Fumarate-FA  (MULTIVITAMIN-PRENATAL) 27-0.8 MG TABS tablet Take 1 tablet by mouth daily at 12 noon. (Patient not taking: Reported on 08/14/2019) 100 each 3   No current facility-administered medications on file prior to visit.    ALLERGIES: No Known Allergies  FAMILY HISTORY: Family History  Problem Relation Age of Onset  . Diabetes Mother    SOCIAL HISTORY: Social History   Socioeconomic History  . Marital status: Married    Spouse name: Not on file  . Number of children: Not on file  . Years of education: Not on file  . Highest education level: Not on file  Occupational History  . Not on file  Tobacco Use  . Smoking status: Never Smoker  . Smokeless tobacco: Never Used  Vaping Use  . Vaping Use: Never used  Substance and Sexual Activity  . Alcohol use: No  . Drug use: No  . Sexual activity: Yes  Other Topics Concern  . Not on file  Social History Narrative   Leave with husband and 2 kids   1-story home without steps   Drink 1-cup coffee occasion   3 years college   Right handed   Social Determinants of Health   Financial Resource Strain:   . Difficulty of Paying Living Expenses: Not on file  Food Insecurity:   . Worried About 10/14/2019 in the Last Year: Not on file  . Ran Out of Food in the Last Year: Not on file  Transportation Needs:   . Lack of Transportation (Medical): Not on file  . Lack of Transportation (Non-Medical): Not on file  Physical Activity:   .  Days of Exercise per Week: Not on file  . Minutes of Exercise per Session: Not on file  Stress:   . Feeling of Stress : Not on file  Social Connections:   . Frequency of Communication with Friends and Family: Not on file  . Frequency of Social Gatherings with Friends and Family: Not on file  . Attends Religious Services: Not on file  . Active Member of Clubs or Organizations: Not on file  . Attends Banker Meetings: Not on file  . Marital Status: Not on file  Intimate Partner Violence:    . Fear of Current or Ex-Partner: Not on file  . Emotionally Abused: Not on file  . Physically Abused: Not on file  . Sexually Abused: Not on file    PHYSICAL EXAM: Blood pressure 113/77, pulse 91, height 5\' 3"  (1.6 m), weight 201 lb 9.6 oz (91.4 kg), SpO2 99 %, unknown if currently breastfeeding. General: No acute distress.  Patient appears well-groomed.   Head:  Normocephalic/atraumatic Eyes:  Fundi examined but not visualized Neck: supple, no paraspinal tenderness, full range of motion Heart:  Regular rate and rhythm Lungs:  Clear to auscultation bilaterally Back: No paraspinal tenderness Neurological Exam: alert and oriented to person, place, and time. Attention span and concentration intact, recent and remote memory intact, fund of knowledge intact.  Speech fluent and not dysarthric, language intact.  CN II-XII intact. Bulk and tone normal, muscle strength 5/5 throughout.  Sensation to light touch, temperature and vibration intact.  Deep tendon reflexes 2+ throughout, toes downgoing.  Finger to nose and heel to shin testing intact.  Gait normal, Romberg negative.  IMPRESSION: 1.  Idiopathic intracranial hypertension 2.  Pregnant, [redacted] weeks gestation  PLAN: 1.  As she is off of acetazolamide, we will need to monitor closely for vision loss.  I have advised her to follow up with Dr. for repeat eye exam to assess for any visual field deficits.  We can hold off on mediation during her pregnancy as long as she does not demonstrate vision loss. 2.  Follow up after she has her baby.  Krista Blue, DO  CC:  Shon Millet, MD

## 2020-07-08 ENCOUNTER — Other Ambulatory Visit: Payer: Self-pay

## 2020-07-08 ENCOUNTER — Encounter: Payer: Self-pay | Admitting: Neurology

## 2020-07-08 ENCOUNTER — Ambulatory Visit (INDEPENDENT_AMBULATORY_CARE_PROVIDER_SITE_OTHER): Payer: Medicaid Other | Admitting: Neurology

## 2020-07-08 VITALS — BP 113/77 | HR 91 | Ht 63.0 in | Wt 201.6 lb

## 2020-07-08 DIAGNOSIS — Z3A28 28 weeks gestation of pregnancy: Secondary | ICD-10-CM | POA: Diagnosis not present

## 2020-07-08 DIAGNOSIS — G932 Benign intracranial hypertension: Secondary | ICD-10-CM | POA: Diagnosis not present

## 2020-07-08 NOTE — Patient Instructions (Signed)
1.  While off of medication, I would like to have your vision monitored closely.  Please make follow up with Dr. Krista Blue.   2.  Follow up with me after you have your baby.

## 2020-08-02 IMAGING — MR MR MRV HEAD WO/W CM
4 of 5 series · 20 of 48 positions shown · IV contrast (10 gv)
Comparison: MR head without and with contrast 07/18/2019

CLINICAL DATA: Headache.  Blurred vision in the right eye.

EXAM:
MR VENOGRAM HEAD WITHOUT AND WITH CONTRAST
TECHNIQUE: Angiographic images of the intracranial venous structures were
obtained using MRV technique without and with intravenous contrast.

[Series 3: sag inhance (id) · sagittal · 1.8mm · 0.47mm/px · 9 of 292 slices shown]
[im 1/292]
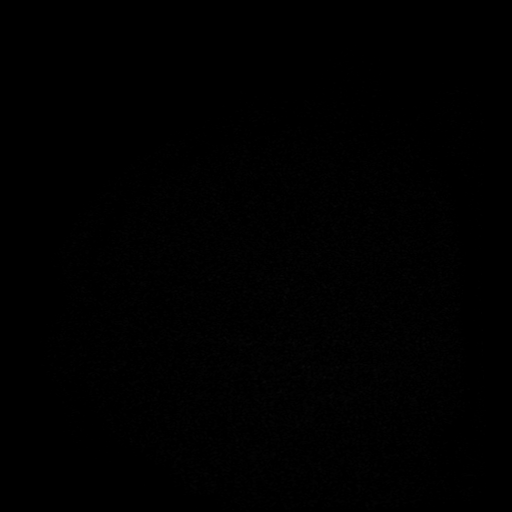
[im 49/292]
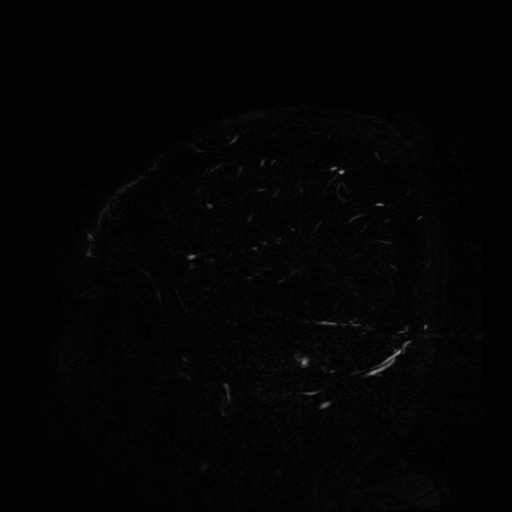
[im 98/292]
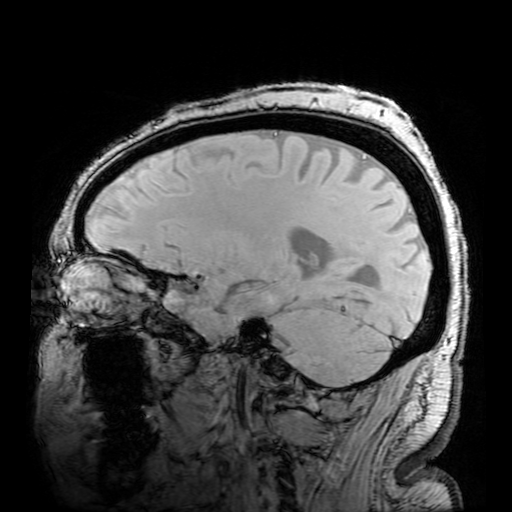
[im 122/292]
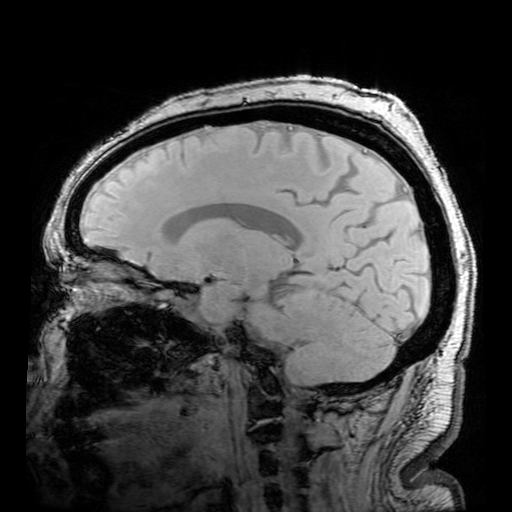
[im 146/292]
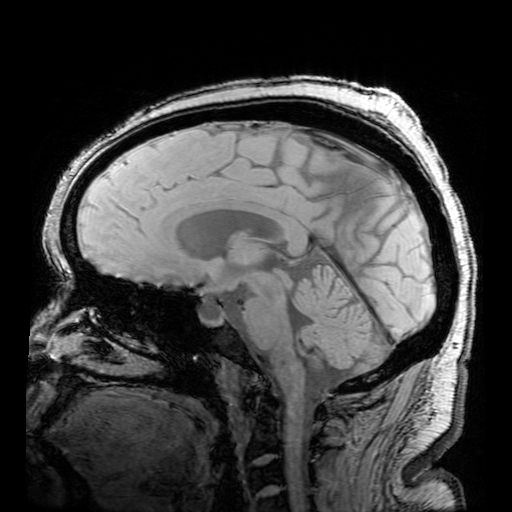
[im 170/292]
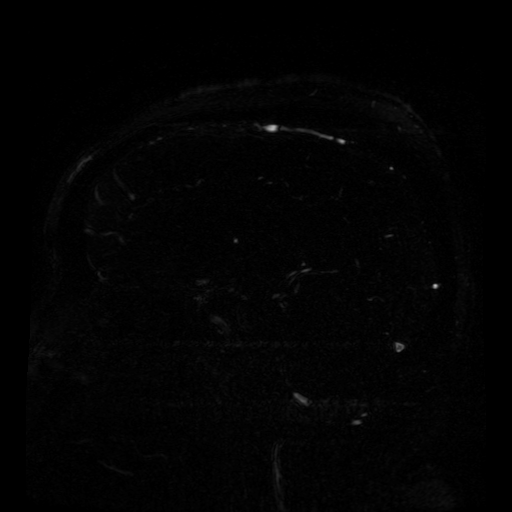
[im 195/292]
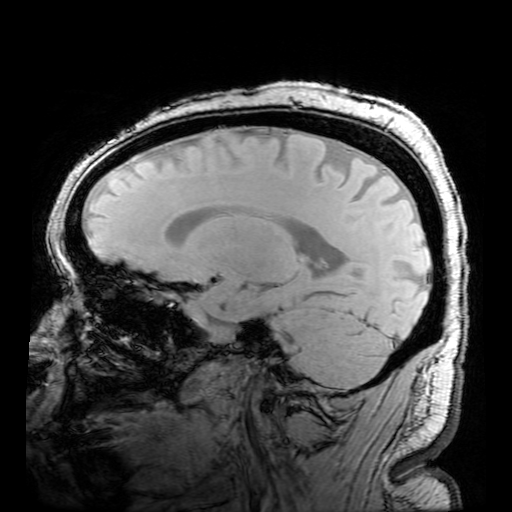
[im 243/292]
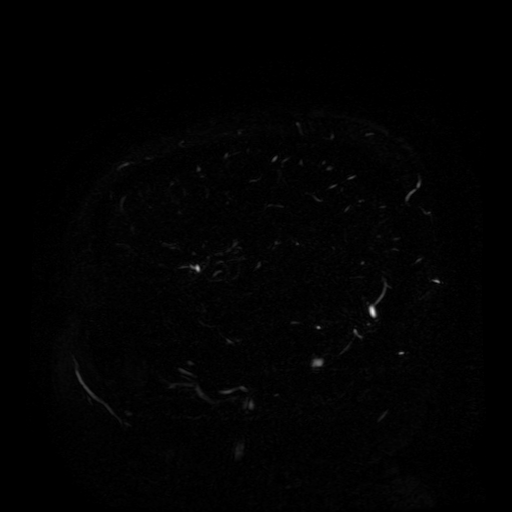
[im 292/292]
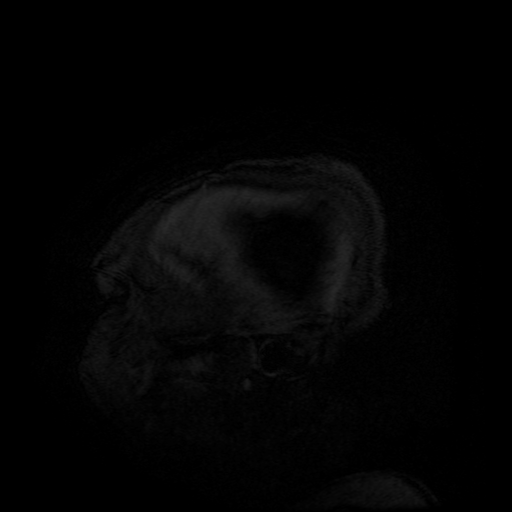

[Series 4: MRV · coronal · 1.5mm · 0.43mm/px · 5 of 120 slices shown]
[im 1/120]
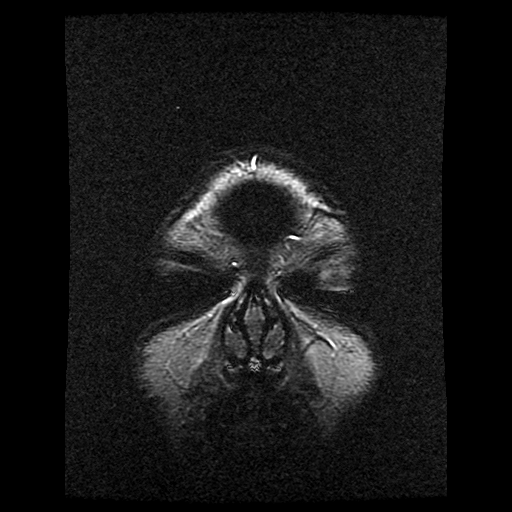
[im 24/120]
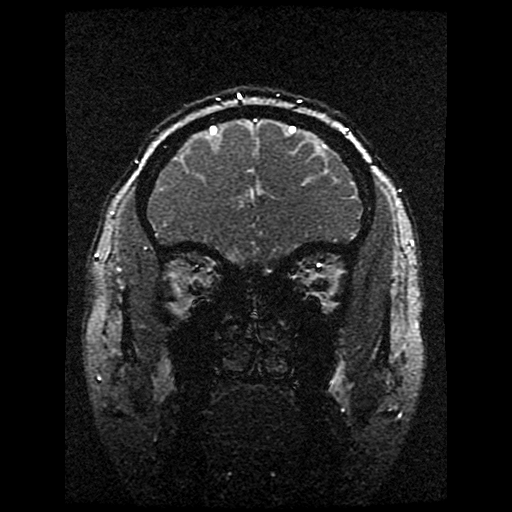
[im 48/120]
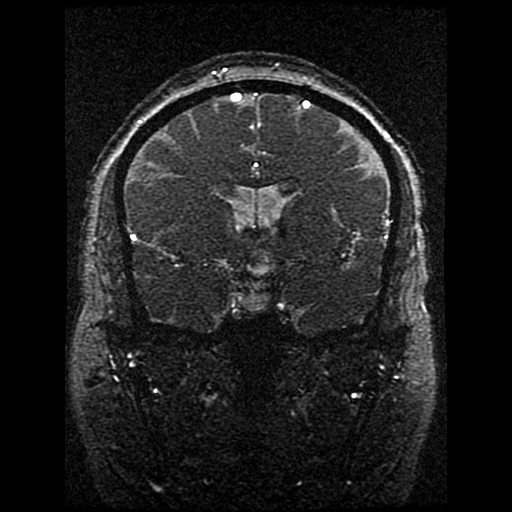
[im 72/120]
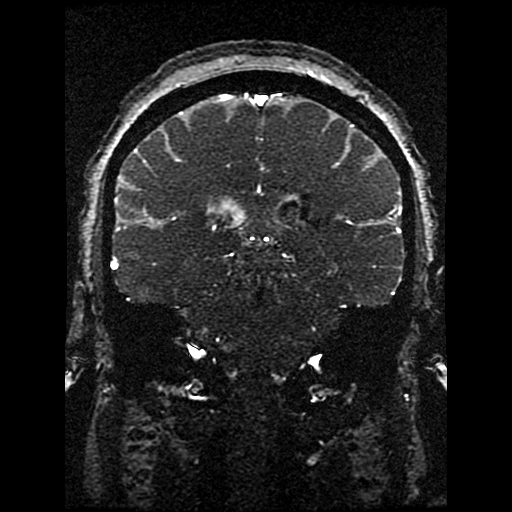
[im 120/120]
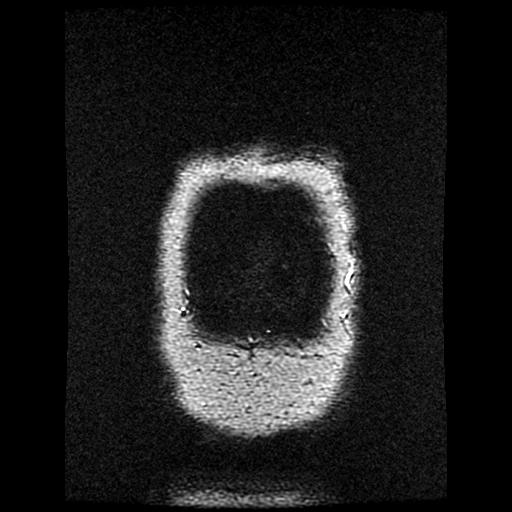

[Series 500: multiplanar reconstruction (mpr) · sagittal · 0.9mm · 0.47mm/px · 3 of 205 slices shown (1 of 2)]
[im 23/205]
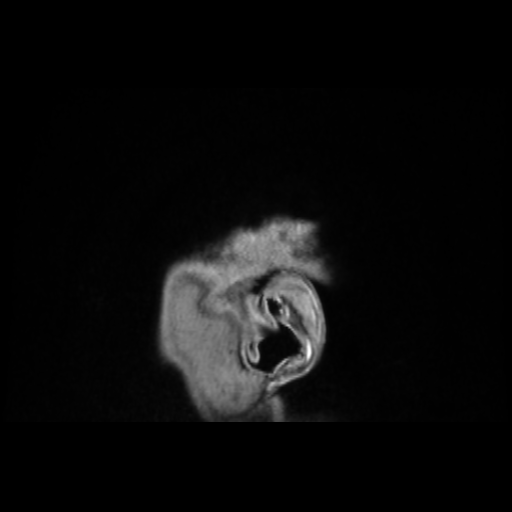
[im 114/205]
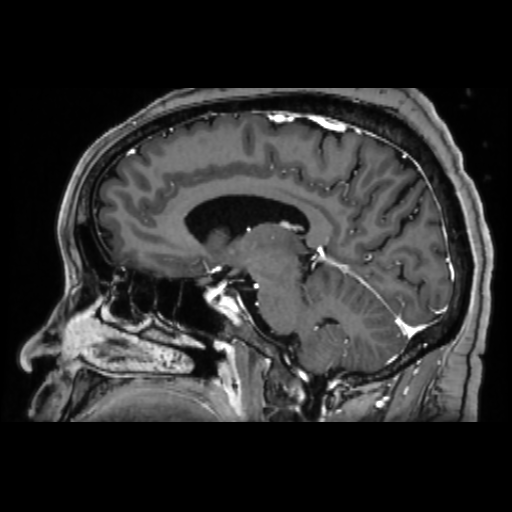
[im 182/205]
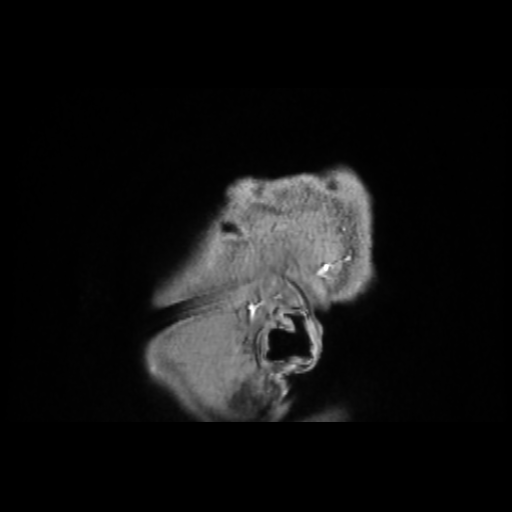

[Series 501: multiplanar reconstruction (mpr) · coronal · 0.9mm · 0.47mm/px · 3 of 253 slices shown (2 of 2)]
[im 46/253]
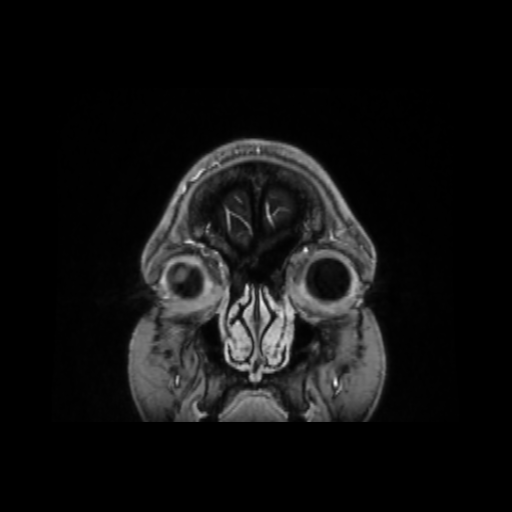
[im 138/253]
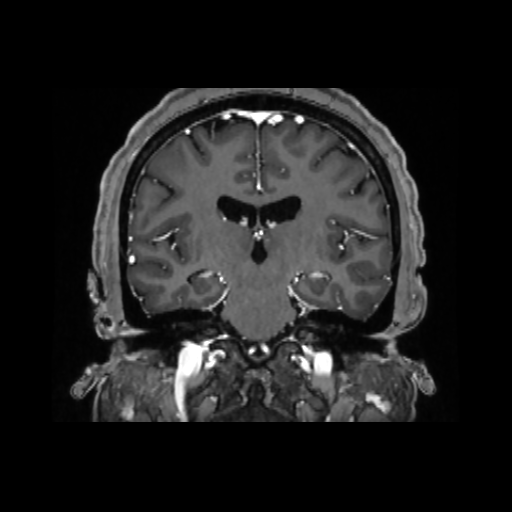
[im 230/253]
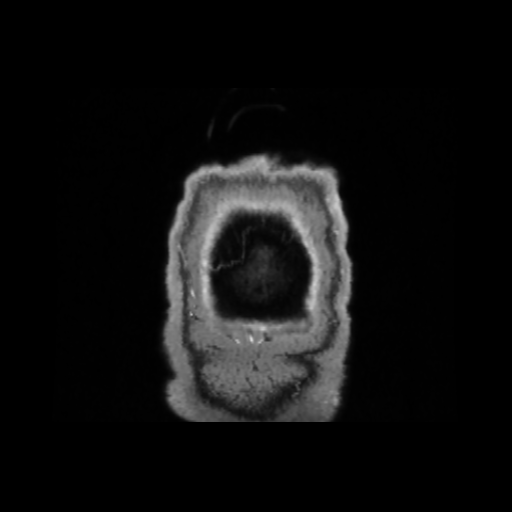

[20 of 48 positions shown; findings below may reference images not displayed]

FINDINGS: The dural sinuses are patent. Straight sinus deep cerebral veins are
intact. Transverse sinuses are codominant. Cortical veins are
unremarkable.
IMPRESSION: Negative MR venogram of the head.

## 2020-09-10 LAB — OB RESULTS CONSOLE GBS: GBS: NEGATIVE

## 2020-09-18 ENCOUNTER — Encounter (HOSPITAL_COMMUNITY): Payer: Self-pay | Admitting: *Deleted

## 2020-09-18 ENCOUNTER — Telehealth (HOSPITAL_COMMUNITY): Payer: Self-pay | Admitting: *Deleted

## 2020-09-18 NOTE — Telephone Encounter (Signed)
Preadmission screen Interpreter number [phone]

## 2020-09-27 ENCOUNTER — Other Ambulatory Visit (HOSPITAL_COMMUNITY)
Admission: RE | Admit: 2020-09-27 | Discharge: 2020-09-27 | Disposition: A | Discharge status: Home / Self Care | Payer: Medicaid Other | Source: Ambulatory Visit | Attending: Obstetrics and Gynecology | Admitting: Obstetrics and Gynecology

## 2020-09-27 DIAGNOSIS — Z20822 Contact with and (suspected) exposure to covid-19: Secondary | ICD-10-CM | POA: Insufficient documentation

## 2020-09-27 DIAGNOSIS — Z01812 Encounter for preprocedural laboratory examination: Secondary | ICD-10-CM | POA: Diagnosis present

## 2020-09-27 LAB — SARS CORONAVIRUS 2 (TAT 6-24 HRS): SARS Coronavirus 2: NEGATIVE

## 2020-09-28 ENCOUNTER — Other Ambulatory Visit: Payer: Self-pay | Admitting: Obstetrics and Gynecology

## 2020-09-29 ENCOUNTER — Inpatient Hospital Stay (HOSPITAL_COMMUNITY): Payer: Medicaid Other

## 2020-09-29 ENCOUNTER — Other Ambulatory Visit: Payer: Self-pay | Admitting: Obstetrics and Gynecology

## 2020-09-29 ENCOUNTER — Encounter (HOSPITAL_COMMUNITY): Payer: Self-pay | Admitting: Obstetrics and Gynecology

## 2020-09-29 ENCOUNTER — Inpatient Hospital Stay (HOSPITAL_COMMUNITY): Payer: Medicaid Other | Admitting: Anesthesiology

## 2020-09-29 ENCOUNTER — Inpatient Hospital Stay (HOSPITAL_COMMUNITY)
Admission: AD | Admit: 2020-09-29 | Discharge: 2020-10-01 | DRG: 807 | Disposition: A | Discharge status: Home / Self Care | Payer: Medicaid Other | Attending: Obstetrics and Gynecology | Admitting: Obstetrics and Gynecology

## 2020-09-29 ENCOUNTER — Other Ambulatory Visit: Payer: Self-pay

## 2020-09-29 DIAGNOSIS — O365930 Maternal care for other known or suspected poor fetal growth, third trimester, not applicable or unspecified: Secondary | ICD-10-CM | POA: Diagnosis present

## 2020-09-29 DIAGNOSIS — Z3A39 39 weeks gestation of pregnancy: Secondary | ICD-10-CM

## 2020-09-29 DIAGNOSIS — O24420 Gestational diabetes mellitus in childbirth, diet controlled: Secondary | ICD-10-CM | POA: Diagnosis present

## 2020-09-29 LAB — COMPREHENSIVE METABOLIC PANEL
ALT: 12 U/L (ref 0–44)
AST: 17 U/L (ref 15–41)
Albumin: 2.6 g/dL — ABNORMAL LOW (ref 3.5–5.0)
Alkaline Phosphatase: 153 U/L — ABNORMAL HIGH (ref 38–126)
Anion gap: 8 (ref 5–15)
BUN: 13 mg/dL (ref 6–20)
CO2: 17 mmol/L — ABNORMAL LOW (ref 22–32)
Calcium: 8.7 mg/dL — ABNORMAL LOW (ref 8.9–10.3)
Chloride: 110 mmol/L (ref 98–111)
Creatinine, Ser: 0.86 mg/dL (ref 0.44–1.00)
GFR, Estimated: >60 mL/min (ref >60)
Glucose, Bld: 75 mg/dL (ref 70–99)
Potassium: 4.1 mmol/L (ref 3.5–5.1)
Sodium: 135 mmol/L (ref 135–145)
Total Bilirubin: 0.9 mg/dL (ref 0.3–1.2)
Total Protein: 6.5 g/dL (ref 6.5–8.1)

## 2020-09-29 LAB — CBC
HCT: 37.2 % (ref 36.0–46.0)
Hemoglobin: 12.4 g/dL (ref 12.0–15.0)
MCH: 27.9 pg (ref 26.0–34.0)
MCHC: 33.3 g/dL (ref 30.0–36.0)
MCV: 83.8 fL (ref 80.0–100.0)
Platelets: 190 10*3/uL (ref 150–400)
RBC: 4.44 MIL/uL (ref 3.87–5.11)
RDW: 13.8 % (ref 11.5–15.5)
WBC: 4.7 10*3/uL (ref 4.0–10.5)
nRBC: 0 % (ref 0.0–0.2)

## 2020-09-29 LAB — PROTEIN / CREATININE RATIO, URINE
Creatinine, Urine: 183.76 mg/dL
Protein Creatinine Ratio: 0.64 mg/mg{Cre} — ABNORMAL HIGH (ref 0.00–0.15)
Total Protein, Urine: 117 mg/dL

## 2020-09-29 LAB — TYPE AND SCREEN
ABO/RH(D): O POS
Antibody Screen: NEGATIVE

## 2020-09-29 LAB — GLUCOSE, CAPILLARY: Glucose-Capillary: 79 mg/dL (ref 70–99)

## 2020-09-29 MED ORDER — WITCH HAZEL-GLYCERIN EX PADS: 1.0000 "application " | MEDICATED_PAD | CUTANEOUS | Status: DC | PRN

## 2020-09-29 MED ORDER — ACETAMINOPHEN 325 MG PO TABS
650.0000 mg | ORAL_TABLET | ORAL | Status: DC | PRN
  Administered 2020-09-29 – 2020-10-01 (×2): 650 mg via ORAL
  Filled 2020-09-29 (×2): qty 2

## 2020-09-29 MED ORDER — ONDANSETRON HCL 4 MG/2ML IJ SOLN: 4.0000 mg | Freq: Four times a day (QID) | INTRAMUSCULAR | Status: DC | PRN

## 2020-09-29 MED ORDER — TETANUS-DIPHTH-ACELL PERTUSSIS 5-2.5-18.5 LF-MCG/0.5 IM SUSY: 0.5000 mL | PREFILLED_SYRINGE | Freq: Once | INTRAMUSCULAR | Status: DC

## 2020-09-29 MED ORDER — ZOLPIDEM TARTRATE 5 MG PO TABS: 5.0000 mg | ORAL_TABLET | Freq: Every evening | ORAL | Status: DC | PRN

## 2020-09-29 MED ORDER — ONDANSETRON HCL 4 MG/2ML IJ SOLN: 4.0000 mg | INTRAMUSCULAR | Status: DC | PRN

## 2020-09-29 MED ORDER — OXYTOCIN BOLUS FROM INFUSION: 333.0000 mL | Freq: Once | INTRAVENOUS | Status: DC

## 2020-09-29 MED ORDER — PHENYLEPHRINE 40 MCG/ML (10ML) SYRINGE FOR IV PUSH (FOR BLOOD PRESSURE SUPPORT): 80.0000 ug | PREFILLED_SYRINGE | INTRAVENOUS | Status: DC | PRN

## 2020-09-29 MED ORDER — COCONUT OIL OIL: 1.0000 "application " | TOPICAL_OIL | Status: DC | PRN

## 2020-09-29 MED ORDER — BENZOCAINE-MENTHOL 20-0.5 % EX AERO: 1.0000 "application " | INHALATION_SPRAY | CUTANEOUS | Status: DC | PRN

## 2020-09-29 MED ORDER — OXYCODONE-ACETAMINOPHEN 5-325 MG PO TABS: 1.0000 | ORAL_TABLET | ORAL | Status: DC | PRN

## 2020-09-29 MED ORDER — TERBUTALINE SULFATE 1 MG/ML IJ SOLN: 0.2500 mg | Freq: Once | INTRAMUSCULAR | Status: DC | PRN

## 2020-09-29 MED ORDER — SOD CITRATE-CITRIC ACID 500-334 MG/5ML PO SOLN: 30.0000 mL | ORAL | Status: DC | PRN

## 2020-09-29 MED ORDER — OXYTOCIN-SODIUM CHLORIDE 30-0.9 UT/500ML-% IV SOLN
1.0000 m[IU]/min | INTRAVENOUS | Status: DC
  Administered 2020-09-29: 2 m[IU]/min via INTRAVENOUS
  Filled 2020-09-29: qty 500

## 2020-09-29 MED ORDER — EPHEDRINE 5 MG/ML INJ: 10.0000 mg | INTRAVENOUS | Status: DC | PRN

## 2020-09-29 MED ORDER — LIDOCAINE HCL (PF) 1 % IJ SOLN
INTRAMUSCULAR | Status: DC | PRN
  Administered 2020-09-29: 5 mL via EPIDURAL

## 2020-09-29 MED ORDER — IBUPROFEN 600 MG PO TABS
600.0000 mg | ORAL_TABLET | Freq: Four times a day (QID) | ORAL | Status: DC
  Administered 2020-09-29 – 2020-10-01 (×7): 600 mg via ORAL
  Filled 2020-09-29 (×7): qty 1

## 2020-09-29 MED ORDER — SODIUM CHLORIDE (PF) 0.9 % IJ SOLN
INTRAMUSCULAR | Status: DC | PRN
  Administered 2020-09-29: 12 mL/h via EPIDURAL

## 2020-09-29 MED ORDER — OXYTOCIN-SODIUM CHLORIDE 30-0.9 UT/500ML-% IV SOLN: 2.5000 [IU]/h | INTRAVENOUS | Status: DC

## 2020-09-29 MED ORDER — ACETAMINOPHEN 325 MG PO TABS: 650.0000 mg | ORAL_TABLET | ORAL | Status: DC | PRN

## 2020-09-29 MED ORDER — DIPHENHYDRAMINE HCL 25 MG PO CAPS: 25.0000 mg | ORAL_CAPSULE | Freq: Four times a day (QID) | ORAL | Status: DC | PRN

## 2020-09-29 MED ORDER — LACTATED RINGERS IV SOLN: 500.0000 mL | INTRAVENOUS | Status: DC | PRN

## 2020-09-29 MED ORDER — FENTANYL-BUPIVACAINE-NACL 0.5-0.125-0.9 MG/250ML-% EP SOLN
12.0000 mL/h | EPIDURAL | Status: DC | PRN
  Filled 2020-09-29: qty 250

## 2020-09-29 MED ORDER — ONDANSETRON HCL 4 MG PO TABS: 4.0000 mg | ORAL_TABLET | ORAL | Status: DC | PRN

## 2020-09-29 MED ORDER — LIDOCAINE HCL (PF) 1 % IJ SOLN
30.0000 mL | INTRAMUSCULAR | Status: DC | PRN
  Filled 2020-09-29: qty 30

## 2020-09-29 MED ORDER — LACTATED RINGERS IV SOLN: INTRAVENOUS | Status: DC

## 2020-09-29 MED ORDER — DIPHENHYDRAMINE HCL 50 MG/ML IJ SOLN: 12.5000 mg | INTRAMUSCULAR | Status: DC | PRN

## 2020-09-29 MED ORDER — PRENATAL MULTIVITAMIN CH
1.0000 | ORAL_TABLET | Freq: Every day | ORAL | Status: DC
  Administered 2020-09-30 – 2020-10-01 (×2): 1 via ORAL
  Filled 2020-09-29 (×2): qty 1

## 2020-09-29 MED ORDER — SIMETHICONE 80 MG PO CHEW: 80.0000 mg | CHEWABLE_TABLET | ORAL | Status: DC | PRN

## 2020-09-29 MED ORDER — BUTORPHANOL TARTRATE 1 MG/ML IJ SOLN: 1.0000 mg | INTRAMUSCULAR | Status: DC | PRN

## 2020-09-29 MED ORDER — SENNOSIDES-DOCUSATE SODIUM 8.6-50 MG PO TABS
2.0000 | ORAL_TABLET | ORAL | Status: DC
  Administered 2020-09-29: 2 via ORAL
  Filled 2020-09-29 (×2): qty 2

## 2020-09-29 MED ORDER — LACTATED RINGERS IV SOLN: 500.0000 mL | Freq: Once | INTRAVENOUS | Status: DC

## 2020-09-29 MED ORDER — DIBUCAINE (PERIANAL) 1 % EX OINT: 1.0000 "application " | TOPICAL_OINTMENT | CUTANEOUS | Status: DC | PRN

## 2020-09-29 MED ORDER — OXYCODONE-ACETAMINOPHEN 5-325 MG PO TABS: 2.0000 | ORAL_TABLET | ORAL | Status: DC | PRN

## 2020-09-29 NOTE — Anesthesia Preprocedure Evaluation (Addendum)
Anesthesia Evaluation  Patient identified by MRN, date of birth, ID band Patient awake    Reviewed: Allergy & Precautions, NPO status , Patient's Chart, lab work & pertinent test results  Airway Mallampati: II  TM Distance: >3 FB Neck ROM: Full    Dental no notable dental hx. (+) Dental Advisory Given, Teeth Intact   Pulmonary neg pulmonary ROS,    Pulmonary exam normal breath sounds clear to auscultation       Cardiovascular Exercise Tolerance: Good negative cardio ROS Normal cardiovascular exam Rhythm:Regular Rate:Normal     Neuro/Psych negative neurological ROS  negative psych ROS   GI/Hepatic negative GI ROS, Neg liver ROS,   Endo/Other  diabetes  Renal/GU negative Renal ROS     Musculoskeletal negative musculoskeletal ROS (+)   Abdominal   Peds  Hematology negative hematology ROS (+) Hgb 12.4 Plt 190   Anesthesia Other Findings   Reproductive/Obstetrics (+) Pregnancy                           Anesthesia Physical Anesthesia Plan  ASA: III  Anesthesia Plan: Epidural   Post-op Pain Management:    Induction:   PONV Risk Score and Plan:   Airway Management Planned:   Additional Equipment:   Intra-op Plan:   Post-operative Plan:   Informed Consent: I have reviewed the patients History and Physical, chart, labs and discussed the procedure including the risks, benefits and alternatives for the proposed anesthesia with the patient or authorized representative who has indicated his/her understanding and acceptance.       Plan Discussed with: CRNA  Anesthesia Plan Comments: (39.2 w G3P2 for LEA)        Anesthesia Quick Evaluation

## 2020-09-29 NOTE — Lactation Note (Signed)
This note was copied from a baby's chart. Lactation Consultation Note  Patient Name: Margaret Caldwell Date: 09/29/2020 Reason for consult: Initial assessment;Mother's request;Term;Infant < 6lbs;Maternal endocrine disorder Type of Endocrine Disorder?: PCOS (Gestational Diabetes diet controlled)   Infant is 39 weeks 4 hours old. Infant in the nursery for temperature regulation. LC not able to assess a latch as a result.    Mom hx of breastfeeding last two children for 9 months and 2 years. Mom does not have a breast pump at home. Mom does have WIC and referral faxed to [facility].   Mom set up on DEBP. Flanged size assessed at 24. Mom pumping on the initial phase at the end of the visit for 15 minutes. Mom's nipples are erect and she denies any discomfort with the current flange size.   Pump parts, assembly, cleaning and storage reviewed with parents. Hand expression reviewed and snappies given for EBM collection.   During the visit, Mom is crying complaining of tooth pain on the right side. LC called RN, Catalina Gravel, to let her know Mom is asking for pain meds for her tooth pain.   Plan 1. To feed based on cues 8-12x in 24 hour period no more than 3 hours without an attempt. LC reviewed with parents behavior of LPTI to reduce calorie loss including minimal stimulation, keeping infant STS with hat on and feeding times under 30 minutes.          2. Mom to offer breasts first and look for signs of milk transfer. Mom knows to call [facility] or RN for assistance with a latch.          3. Dad to supplement with Neosure 22 cal/0z 5-10 ml each feed using extra slow flow nipple and paced bottle feeding          4. Mom to pump using DEBP on the initial phase q 3 hours for 15 minutes.          5. I's and O's sheet reviewed with parents          6. [facility] brochure of inpatient and outpatient services reviewed.          7. All questions answered at the end of the visit.     Margaret Leon   Caldwell 09/29/2020, 10:46 PM

## 2020-09-29 NOTE — H&P (Deleted)
  The note originally documented on this encounter has been moved the the encounter in which it belongs.  

## 2020-09-29 NOTE — Progress Notes (Signed)
Patient ID: Margaret Caldwell, female   DOB: [birth year], 29 y.o.   MRN: [id number] Pt took 4 hours to arrive from her call in this morning.  Now in room and pitocin going  FHR category 1  50/2-3/-2 AROM clear at 1330  Increase pitocin BP a little borderline on first reading, will follow Epidural prn

## 2020-09-29 NOTE — Anesthesia Procedure Notes (Signed)
Epidural Patient location during procedure: OB Start time: 09/29/2020 4:39 PM End time: 09/29/2020 4:55 PM  Staffing Anesthesiologist: Trevor Iha, MD Performed: anesthesiologist   Preanesthetic Checklist Completed: patient identified, IV checked, site marked, risks and benefits discussed, surgical consent, monitors and equipment checked, pre-op evaluation and timeout performed  Epidural Patient position: sitting Prep: DuraPrep and site prepped and draped Patient monitoring: continuous pulse ox and blood pressure Approach: midline Location: L3-L4 Injection technique: LOR air  Needle:  Needle type: Tuohy  Needle gauge: 17 G Needle length: 9 cm and 9 Needle insertion depth: 9 cm Catheter type: closed end flexible Catheter size: 19 Gauge Catheter at skin depth: 15 cm Test dose: negative  Assessment Events: blood not aspirated, injection not painful, no injection resistance, no paresthesia and negative IV test  Additional Notes Patient identified. Risks/Benefits/Options discussed with patient including but not limited to bleeding, infection, nerve damage, paralysis, failed block, incomplete pain control, headache, blood pressure changes, nausea, vomiting, reactions to medication both or allergic, itching and postpartum back pain. Confirmed with bedside nurse the patient's most recent platelet count. Confirmed with patient that they are not currently taking any anticoagulation, have any bleeding history or any family history of bleeding disorders. Patient expressed understanding and wished to proceed. All questions were answered. Sterile technique was used throughout the entire procedure. Please see nursing notes for vital signs. Test dose was given through epidural needle and negative prior to continuing to dose epidural or start infusion. Warning signs of high block given to the patient including shortness of breath, tingling/numbness in hands, complete motor block, or any  concerning symptoms with instructions to call for help. Patient was given instructions on fall risk and not to get out of bed. All questions and concerns addressed with instructions to call with any issues. 2 Attempt (S) . Patient tolerated procedure well.
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
## 2020-09-30 LAB — CBC
HCT: 35 % — ABNORMAL LOW (ref 36.0–46.0)
Hemoglobin: 11.5 g/dL — ABNORMAL LOW (ref 12.0–15.0)
MCH: 27.3 pg (ref 26.0–34.0)
MCHC: 32.9 g/dL (ref 30.0–36.0)
MCV: 82.9 fL (ref 80.0–100.0)
Platelets: 174 10*3/uL (ref 150–400)
RBC: 4.22 MIL/uL (ref 3.87–5.11)
RDW: 13.8 % (ref 11.5–15.5)
WBC: 7.5 10*3/uL (ref 4.0–10.5)
nRBC: 0 % (ref 0.0–0.2)

## 2020-09-30 LAB — RPR: RPR Ser Ql: NONREACTIVE

## 2020-09-30 NOTE — Anesthesia Postprocedure Evaluation (Signed)
Anesthesia Post Note  Patient: Margaret Caldwell  Procedure(s) Performed: AN AD HOC LABOR EPIDURAL     Patient location during evaluation: Mother Baby Anesthesia Type: Epidural Level of consciousness: awake and alert Pain management: pain level controlled Vital Signs Assessment: post-procedure vital signs reviewed and stable Respiratory status: spontaneous breathing, nonlabored ventilation and respiratory function stable Cardiovascular status: stable Postop Assessment: no headache, no backache, epidural receding, no apparent nausea or vomiting, patient able to bend at knees, adequate PO intake and able to ambulate Anesthetic complications: no   No complications documented.  Last Vitals:  Vitals:   09/30/20 0125 09/30/20 0521  BP: (!) 130/95 101/85  Pulse: 89 83  Resp: 16 16  Temp: 37.1 C 36.9 C  SpO2:      Last Pain:  Vitals:   09/30/20 0521  TempSrc: Oral  PainSc: 0-No pain   Pain Goal:                   [name]

## 2020-09-30 NOTE — Progress Notes (Signed)
Patient ID: Margaret Caldwell, female   DOB: February 10, 1991, 29 y.o.   MRN: [id number] BP overnight improving but still a bit borderline.  PIH labs WNL except prot:creat ratio was .64 but admittedly the sample was from a cath specimen at delivery and contaminated with some blood as done with an in and out. Baby a bit SGA and patient will need to stay 2 nights.  If high BP today, will start on procardia XL 30 mg po q day.

## 2020-09-30 NOTE — Lactation Note (Signed)
This note was copied from a baby's chart. Lactation Consultation Note  Patient Name: Margaret Caldwell [id number] Date: 09/30/2020 Reason for consult: Follow-up assessment;Mother's request;Term Type of Endocrine Disorder?: PCOS (GDM diet controlled)  Infant is 27 hours old and 39 weeks. Mom is both breast and bottle feeding. Formula volumes with Similac 22 cal/oz 12- 38 ml. Mom also offering breast but now with every feeding, breastfeeding times 5 and 10 minutes for last 2 feedings.   LC reviewed with Mom the importance of latching infant at the breast first, offering both breasts, and looking for signs of milk transfer. Reviewed with Mom to feed based on cues and not to allow more than 3 hours go by without an attempt.   Mom denies any nipple pain with latching. Mom did not use the DEBP since we started it yesterday. LC explained to her the importance of using the pump for stimulation and milk let down. Mom's nipples are both erect with no signs of nipple trauma. LC assisted Mom latching infant in football on the left. Small compression stripe noted on the left. We latched infant on the right and went over positioning to ensure a deep latch. Signs of milk transfer noted during feedings.   Plan 1. To feed based on cues at the breast first.           2. Dad to supplement based on LPTI breastfeeding supplementation guideline and hours after birth of EBM or formula. (based on age supplementation volumes 10-20 ml as tolerated)         3. Mom to pump using DEBP q 3 hours for 15 minutes.        4. All questions answered at the end of the visit.    Consult Status Consult Status: Follow-up Date: 10/01/20 Follow-up type: In-patient    Margaret Shedd  Caldwell 09/30/2020, 9:49 PM
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
## 2020-09-30 NOTE — Progress Notes (Addendum)
2:22pm-CSW followed up with S. Larey Seat from [facility] CPS. CSW was advised that S. Larey Seat would be completing other task to aide in case and then following up with family once arrived home. CSW was again advised that case was assigned as a 72 hour response time. CSW did advise CPS of concerns, however when asked about MOB bonding with infant and ability to care for infant, CSW noted no concersn to CPS of this .  Per CPS,no barriers to infant discharging home and CPS will be following up with family within 72 hours.    CSW received call back from P. Hyacinth Meeker with [facility] CPS and was advised that case was assigned to S. Wainwright [phone]. CSW spoke with S. Larey Seat and was advised that case was assigned as a 72 hour response time however S. Wainwright to follow up with CSW  Once case has been staffed with other parties.   Claude Manges Azaria Bartell, MSW, LCSW [facility] [phone]

## 2020-09-30 NOTE — Progress Notes (Signed)
Post Partum Day 1 Subjective: no complaints, up ad lib, voiding, tolerating PO, + flatus and lochia mild. Pain mostly when breastfeeding only as expected. Has no complaints at this time. CSW in room and assessing pt needs.   Objective: Blood pressure 113/80, pulse 70, temperature 97.9 F (36.6 C), temperature source Oral, resp. rate 18, height 5\' 2"  (1.575 m), weight 93.9 kg, SpO2 100 %, unknown if currently breastfeeding.  Physical Exam:  General: alert, cooperative and no distress Lochia: appropriate Uterine Fundus: firm Incision: n/a DVT Evaluation: No evidence of DVT seen on physical exam. Negative Homan's sign.  Recent Labs    09/29/20 1249 09/30/20 0610  HGB 12.4 11.5*  HCT 37.2 35.0*    Assessment/Plan: Plan for discharge tomorrow  Routine pp care   LOS: 1 day   Margaret Caldwell 09/30/2020, 9:59 AM

## 2020-10-01 MED ORDER — IBUPROFEN 600 MG PO TABS
600.0000 mg | ORAL_TABLET | Freq: Four times a day (QID) | ORAL | 0 refills | Status: DC
Start: 2020-10-01 — End: 2022-09-01

## 2020-10-01 NOTE — Progress Notes (Signed)
PPD #2 Doing well Afeb, VSS D/c home 

## 2020-10-01 NOTE — Discharge Summary (Signed)
Postpartum Discharge Summary      Patient Name: Margaret Caldwell DOB: 20-Oct-1991 MRN: [id number]  Date of admission: 09/29/2020 Delivery date:09/29/2020  Delivering provider: Huel Cote  Date of discharge: 10/01/2020  Admitting diagnosis: Indication for care in labor and delivery, antepartum [O75.9] NSVD (normal spontaneous vaginal delivery) [O80] Intrauterine pregnancy: [redacted]w[redacted]d     Secondary diagnosis:  Active Problems:   Indication for care in labor and delivery, antepartum   NSVD (normal spontaneous vaginal delivery)     Discharge diagnosis: Term Pregnancy Delivered                                              Hospital course: Induction of Labor With Vaginal Delivery   29 y.o. yo [phone] at [redacted]w[redacted]d was admitted to the hospital 09/29/2020 for induction of labor.  Indication for induction: Favorable cervix at term.  Patient had an uncomplicated labor course as follows: Membrane Rupture Time/Date: 1:24 PM ,09/29/2020   Delivery Method:Vaginal, Spontaneous  Episiotomy: None  Lacerations:  2nd degree  Details of delivery can be found in separate delivery note.  Patient had a routine postpartum course. Patient is discharged home 10/01/20.  Newborn Data: Birth date:09/29/2020  Birth time:5:57 PM  Gender:Female  Living status:Living  Apgars:9 ,10  Weight:2534 g    Physical exam  Vitals:   09/30/20 0757 09/30/20 1440 09/30/20 2020 10/01/20 0512  BP: 113/80 121/84 112/79   Pulse: 70 82 84 88  Resp: 18 16 15 18   Temp: 97.9 F (36.6 C) 98.2 F (36.8 C) 98.2 F (36.8 C) 98.2 F (36.8 C)  TempSrc: Oral Oral Oral Oral  SpO2: 100% 98% 100% 100%  Weight:      Height:       General: alert Lochia: appropriate Uterine Fundus: firm  Labs: Lab Results  Component Value Date   WBC 7.5 09/30/2020   HGB 11.5 (L) 09/30/2020   HCT 35.0 (L) 09/30/2020   MCV 82.9 09/30/2020   PLT 174 09/30/2020   CMP Latest Ref Rng & Units 09/29/2020  Glucose 70 - 99 mg/dL 75  BUN 6  - 20 mg/dL 13  Creatinine 10/01/2020 - 3.33 mg/dL 8.32  Sodium 9.19 - 166 mmol/L 135  Potassium 3.5 - 5.1 mmol/L 4.1  Chloride 98 - 111 mmol/L 110  CO2 22 - 32 mmol/L 17(L)  Calcium 8.9 - 10.3 mg/dL 060)  Total Protein 6.5 - 8.1 g/dL 6.5  Total Bilirubin 0.3 - 1.2 mg/dL 0.9  Alkaline Phos 38 - 126 U/L 153(H)  AST 15 - 41 U/L 17  ALT 0 - 44 U/L 12   Edinburgh Score: Edinburgh Postnatal Depression Scale Screening Tool 09/30/2020  I have been able to laugh and see the funny side of things. 0  I have looked forward with enjoyment to things. 0  I have blamed myself unnecessarily when things went wrong. 0  I have been anxious or worried for no good reason. 0  I have felt scared or panicky for no good reason. 0  Things have been getting on top of me. 0  I have been so unhappy that I have had difficulty sleeping. 0  I have felt sad or miserable. 0  I have been so unhappy that I have been crying. 0  The thought of harming myself has occurred to me. 0  Edinburgh Postnatal Depression Scale Total  0      After visit meds:  Allergies as of 10/01/2020   No Known Allergies     Medication List    TAKE these medications   acetaZOLAMIDE 500 MG capsule Commonly known as: DIAMOX Take 1 capsule (500 mg total) by mouth 2 (two) times daily.   ibuprofen 600 MG tablet Commonly known as: ADVIL Take 1 tablet (600 mg total) by mouth every 6 (six) hours. What changed:   when to take this  reasons to take this   multivitamin-prenatal 27-0.8 MG Tabs tablet Take 1 tablet by mouth daily at 12 noon.        Discharge home in stable condition Infant Feeding: Breast Infant Disposition:home with mother Discharge instruction: per After Visit Summary and Postpartum booklet. Activity: Advance as tolerated. Pelvic rest for 6 weeks.  Diet: routine diet  Postpartum Appointment:6 weeks Follow up Visit:  Follow-up Information    Huel Cote, MD. Schedule an appointment as soon as possible for a  visit in 6 week(s).   Specialty: Obstetrics and Gynecology Contact information: [address] [place] [address] [phone]                   10/01/2020 Zenaida Niece, MD

## 2020-10-01 NOTE — Discharge Instructions (Signed)
As per discharge pamphlet °
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
## 2021-02-03 ENCOUNTER — Ambulatory Visit (INDEPENDENT_AMBULATORY_CARE_PROVIDER_SITE_OTHER): Payer: Medicaid Other | Admitting: Neurology

## 2021-02-03 ENCOUNTER — Encounter: Payer: Self-pay | Admitting: Neurology

## 2021-02-03 ENCOUNTER — Other Ambulatory Visit: Payer: Self-pay

## 2021-02-03 VITALS — BP 138/80 | HR 89 | Resp 20 | Ht 64.0 in | Wt 206.0 lb

## 2021-02-03 DIAGNOSIS — G932 Benign intracranial hypertension: Secondary | ICD-10-CM | POA: Diagnosis not present

## 2021-02-03 NOTE — Patient Instructions (Signed)
1.  Continue acetazolamide 500mg  twice daily 2.  Check CBC and CMP 3.  When you see the eye doctor, have results sent to me 4.  If vision gets worse before then, contact me 5.  Otherwise, follow up 4 to 6 months.

## 2021-02-11 ENCOUNTER — Other Ambulatory Visit: Payer: Self-pay

## 2021-02-11 ENCOUNTER — Other Ambulatory Visit (INDEPENDENT_AMBULATORY_CARE_PROVIDER_SITE_OTHER): Payer: Medicaid Other

## 2021-02-11 DIAGNOSIS — G932 Benign intracranial hypertension: Secondary | ICD-10-CM

## 2021-02-11 LAB — CBC
HCT: 39.9 % (ref 36.0–46.0)
Hemoglobin: 13.2 g/dL (ref 12.0–15.0)
MCHC: 32.9 g/dL (ref 30.0–36.0)
MCV: 85.4 fl (ref 78.0–100.0)
Platelets: 238 10*3/uL (ref 150.0–400.0)
RBC: 4.67 Mil/uL (ref 3.87–5.11)
RDW: 14.2 % (ref 11.5–15.5)
WBC: 4.8 10*3/uL (ref 4.0–10.5)

## 2021-02-11 LAB — COMPREHENSIVE METABOLIC PANEL
ALT: 20 U/L (ref 0–35)
AST: 17 U/L (ref 0–37)
Albumin: 4.4 g/dL (ref 3.5–5.2)
Alkaline Phosphatase: 85 U/L (ref 39–117)
BUN: 16 mg/dL (ref 6–23)
CO2: 23 mEq/L (ref 19–32)
Calcium: 9.2 mg/dL (ref 8.4–10.5)
Chloride: 107 mEq/L (ref 96–112)
Creatinine, Ser: 0.77 mg/dL (ref 0.40–1.20)
GFR: 102.98 mL/min (ref >60.00)
Glucose, Bld: 114 mg/dL — ABNORMAL HIGH (ref 70–99)
Potassium: 4 mEq/L (ref 3.5–5.1)
Sodium: 138 mEq/L (ref 135–145)
Total Bilirubin: 0.3 mg/dL (ref 0.2–1.2)
Total Protein: 7.8 g/dL (ref 6.0–8.3)

## 2021-03-17 ENCOUNTER — Other Ambulatory Visit: Payer: Self-pay | Admitting: Neurology

## 2021-03-17 NOTE — Telephone Encounter (Signed)
Patient needs to schedule a visit to get further refills.

## 2021-04-14 ENCOUNTER — Telehealth: Payer: Self-pay | Admitting: Neurology

## 2021-04-14 NOTE — Telephone Encounter (Signed)
She was supposed to have a repeat eye exam in late May.  This exam needs to be done.  If it was done, I will need it faxed to me for review.

## 2021-04-14 NOTE — Telephone Encounter (Signed)
Patient called and said she has had a headache for 2 days and the medication Dr. Everlena Cooper gave her is not helping.  She'd like to know of any other options he may recommend?  [facility] [address]

## 2021-04-15 NOTE — Telephone Encounter (Signed)
Pt called no answer left a voice mail for pt to call the office back. Also She was supposed to have a repeat eye exam in late May.  This exam needs to be done.  If it was done, Dr Everlena Cooper will need it faxed to him for review

## 2021-06-16 ENCOUNTER — Other Ambulatory Visit: Payer: Self-pay | Admitting: Neurology
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
## 2021-08-19 ENCOUNTER — Ambulatory Visit: Payer: Medicaid Other | Admitting: Neurology
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
## 2021-11-26 ENCOUNTER — Ambulatory Visit: Payer: Medicaid Other | Admitting: Neurology
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
## 2021-12-28 ENCOUNTER — Other Ambulatory Visit: Payer: Self-pay

## 2021-12-28 ENCOUNTER — Encounter: Payer: Self-pay | Admitting: Neurology

## 2021-12-28 ENCOUNTER — Ambulatory Visit: Payer: Medicaid Other | Admitting: Neurology

## 2021-12-28 VITALS — BP 105/65 | HR 92 | Ht 63.0 in | Wt 210.4 lb

## 2021-12-28 DIAGNOSIS — G932 Benign intracranial hypertension: Secondary | ICD-10-CM

## 2021-12-28 MED ORDER — ACETAZOLAMIDE ER 500 MG PO CP12: 500.0000 mg | ORAL_CAPSULE | Freq: Two times a day (BID) | ORAL | 3 refills | Status: DC

## 2021-12-28 NOTE — Patient Instructions (Signed)
Refilled acetazolamide 500mg  twice daily Follow up 4 months.
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
## 2022-04-30 ENCOUNTER — Encounter: Payer: Self-pay | Admitting: Neurology

## 2022-04-30 ENCOUNTER — Other Ambulatory Visit (INDEPENDENT_AMBULATORY_CARE_PROVIDER_SITE_OTHER): Payer: Medicaid Other

## 2022-04-30 ENCOUNTER — Ambulatory Visit: Payer: Medicaid Other | Admitting: Neurology

## 2022-04-30 VITALS — BP 109/71 | HR 91 | Ht 63.0 in | Wt 217.2 lb

## 2022-04-30 DIAGNOSIS — G932 Benign intracranial hypertension: Secondary | ICD-10-CM

## 2022-04-30 LAB — BASIC METABOLIC PANEL
BUN: 12 mg/dL (ref 6–23)
CO2: 30 mEq/L (ref 19–32)
Calcium: 9 mg/dL (ref 8.4–10.5)
Chloride: 101 mEq/L (ref 96–112)
Creatinine, Ser: 0.69 mg/dL (ref 0.40–1.20)
GFR: 114.87 mL/min (ref >60.00)
Glucose, Bld: 98 mg/dL (ref 70–99)
Potassium: 4.3 mEq/L (ref 3.5–5.1)
Sodium: 138 mEq/L (ref 135–145)

## 2022-04-30 MED ORDER — ACETAZOLAMIDE ER 500 MG PO CP12: 1000.0000 mg | ORAL_CAPSULE | Freq: Two times a day (BID) | ORAL | 5 refills | Status: DC

## 2022-05-01 ENCOUNTER — Other Ambulatory Visit: Payer: Self-pay | Admitting: Physician Assistant

## 2022-05-01 MED ORDER — ACETAZOLAMIDE ER 500 MG PO CP12: 1000.0000 mg | ORAL_CAPSULE | Freq: Two times a day (BID) | ORAL | 5 refills | Status: DC

## 2022-05-06 ENCOUNTER — Telehealth: Payer: Self-pay | Admitting: Neurology

## 2022-05-06 NOTE — Telephone Encounter (Signed)
Spoke to patient, Per patient she was advised the script wasn't ready for pick up.  Telephone call to [employer] [facility] for Diamox been ready for four days now.   Telephone call back to patient script ready.

## 2022-05-06 NOTE — Telephone Encounter (Signed)
Patient called and said she'd like a call back about her medication dosage and frequency for her generic Diamox. She wants to confirm before she takes it.
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
## 2022-09-01 ENCOUNTER — Encounter: Payer: Self-pay | Admitting: Neurology

## 2022-09-01 ENCOUNTER — Ambulatory Visit: Payer: Medicaid Other | Admitting: Neurology

## 2022-09-01 VITALS — BP 115/77 | HR 105 | Ht 63.0 in | Wt 209.0 lb

## 2022-09-01 DIAGNOSIS — G932 Benign intracranial hypertension: Secondary | ICD-10-CM | POA: Diagnosis not present

## 2022-09-01 NOTE — Patient Instructions (Signed)
Continue acetazolamide 1000mg  twice daily for now.  If eye exam still looks good at next visit, will start reducing dose.  Follow up with me in 6 months.

## 2022-09-09 ENCOUNTER — Ambulatory Visit: Payer: Medicaid Other | Admitting: Neurology

## 2023-01-21 ENCOUNTER — Telehealth: Payer: Self-pay | Admitting: Neurology

## 2023-01-21 MED ORDER — PREDNISONE 10 MG (21) PO TBPK: ORAL_TABLET | ORAL | 0 refills | Status: DC

## 2023-01-21 NOTE — Telephone Encounter (Signed)
Pt husband called an informed Dr Tomi Likens stated It sounds like she may have a pinched nerve in the neck.  We can prescribe her a prednisone taper to calm the pain and hopefully break the headache. Dr Tomi Likens would also recommend following up with the eye doctor as soon as possible to make sure there isn't worsening of the pressure in her head. he verbalized understanding

## 2023-01-21 NOTE — Telephone Encounter (Signed)
Pt called no answer left a voice mail to call back °

## 2023-01-21 NOTE — Telephone Encounter (Signed)
Pt's husband called in stating the pt has been having a headache for the last few days, ringing in her ears, and having pain in her shoulder and right hand. They would like some advice.

## 2023-01-24 NOTE — Telephone Encounter (Addendum)
Wilcox saw patient in January, notes were faxed over to [place] this morning and from at a quick glance patient wasn't having any worsening problems at the time of the appointment.
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
## 2023-03-04 ENCOUNTER — Encounter: Payer: Self-pay | Admitting: Neurology

## 2023-03-04 ENCOUNTER — Ambulatory Visit: Payer: Medicaid Other | Admitting: Neurology

## 2023-03-09 ENCOUNTER — Telehealth: Payer: Self-pay | Admitting: Neurology

## 2023-03-09 NOTE — Telephone Encounter (Signed)
Patient dismissed from [facility] and all providers practicing at this clinic. We have reached this decision due to our office No Show Policy 03/04/23

## 2023-04-19 ENCOUNTER — Other Ambulatory Visit: Payer: Self-pay | Admitting: Obstetrics and Gynecology

## 2023-04-19 DIAGNOSIS — N6311 Unspecified lump in the right breast, upper outer quadrant: Secondary | ICD-10-CM

## 2023-04-25 ENCOUNTER — Encounter: Payer: Self-pay | Admitting: Neurology

## 2023-04-25 ENCOUNTER — Ambulatory Visit: Payer: Medicaid Other | Admitting: Neurology

## 2023-04-25 VITALS — BP 104/67 | HR 78 | Ht 62.0 in | Wt 194.0 lb

## 2023-04-25 DIAGNOSIS — G932 Benign intracranial hypertension: Secondary | ICD-10-CM | POA: Diagnosis not present

## 2023-04-25 MED ORDER — ACETAZOLAMIDE ER 500 MG PO CP12: 1000.0000 mg | ORAL_CAPSULE | Freq: Two times a day (BID) | ORAL | 2 refills | Status: DC

## 2023-04-25 NOTE — Patient Instructions (Addendum)
See eye doctor send notes to me Start diamox again  Idiopathic Intracranial Hypertension  Idiopathic intracranial hypertension (IIH) is a condition that increases pressure around the brain. The fluid that surrounds the brain and spinal cord (cerebrospinal fluid, or CSF) increases and causes the pressure. Idiopathic means that the cause of this condition is not known. IIH affects the brain and spinal cord. If this condition is not treated, it can cause vision loss or blindness. What are the causes? The cause of this condition is not known. What increases the risk? The following factors may make you more likely to develop this condition: Being obese. Being a person who is female, between the ages of 58 and 90 years old, and who has not gone through menopause. Taking certain medicines, such as birth control, acne medicines, or steroids. What are the signs or symptoms? Symptoms of this condition include: Headaches. This is the most common symptom. Brief periods of total blindness. Double vision, blurred vision, or poor side (peripheral) vision. Pain in the shoulders or neck. Nausea and vomiting. A sound like rushing water or a pulsing sound within the ears (pulsatile tinnitus), or ringing in the ears. How is this diagnosed? This condition may be diagnosed based on: Your symptoms and medical history. Imaging tests of the brain, such as: CT scan. MRI. Magnetic resonance venogram (MRV) to check the veins. Diagnostic lumbar puncture. This is a procedure to remove and examine a sample of CSF. This procedure can determine whether your fluid pressure is too high. An eye exam to check for swelling or nerve damage in the eyes. How is this treated? Treatment for this condition depends on the symptoms. The goal of treatment is to decrease the pressure around your brain. Common treatments include: Weight loss through healthy eating, salt restriction, and exercise, if you are overweight. Medicines  to decrease the production of CSF and lower the pressure within your skull. Medicines to prevent or treat headaches. Other treatments may include: Surgery to place drains (shunts) in your brain to remove extra fluid. Lumbar puncture to remove extra CSF. Follow these instructions at home: If you are overweight or obese, work with your health care provider to lose weight. Take over-the-counter and prescription medicines only as told by your health care provider. Ask your health care provider if the medicine prescribed to you requires you to avoid driving or using machinery. Do not use any products that contain nicotine or tobacco. These products include cigarettes, chewing tobacco, and vaping devices, such as e-cigarettes. If you need help quitting, ask your health care provider. Keep all follow-up visits. Your health care provider will need to monitor you regularly. Contact a health care provider if: You have changes in your vision, such as: Double vision. Blurred vision. Poor peripheral vision. Get help right away if: You have any of the following symptoms and they get worse or do not get better: Headaches. Nausea. Vomiting. Sudden trouble seeing. This information is not intended to replace advice given to you by your health care provider. Make sure you discuss any questions you have with your health care provider. Document Revised: 03/23/2022 Document Reviewed: 03/02/2022 Elsevier Patient Education  2024 Elsevier Inc.   Acetazolamide Tablets What is this medication? ACETAZOLAMIDE (a set a ZOLE a mide) reduces swelling related to heart disease. It may also be used to reduce swelling caused by medications. It helps your kidneys remove more fluid and salt from your blood through the urine. It may also be used to treat conditions with  increased pressure of the eye, such as glaucoma. It can be used with other medications to prevent and control seizures in people with epilepsy. It can also  be used to prevent or treat symptoms of altitude sickness. It works by increasing the amount of oxygen in your body. It belongs to a group of medications called diuretics. This medicine may be used for other purposes; ask your health care provider or pharmacist if you have questions. COMMON BRAND NAME(S): Diamox What should I tell my care team before I take this medication? They need to know if you have any of these conditions: Glaucoma Kidney disease Liver disease Low adrenal gland function Lung or breathing disease An unusual or allergic reaction to acetazolamide, other medications, foods, dyes, or preservatives Pregnant or trying to get pregnant Breast-feeding How should I use this medication? Take this medication by mouth. Take it as directed on the prescription label at the same time every day. You can take it with or without food. If it upsets your stomach, take it with food. Keep taking it unless your care team tells you to stop. Talk to your care team about the use of this medication in children. Special care may be needed. Overdosage: If you think you have taken too much of this medicine contact a poison control center or emergency room at once. NOTE: This medicine is only for you. Do not share this medicine with others. What if I miss a dose? If you miss a dose, take it as soon as you can. If it is almost time for your next dose, take only that dose. Do not take double or extra doses. What may interact with this medication? Do not take this medication with any of the following: Methazolamide This medication may also interact with the following: Aspirin and aspirin-like medications Cyclosporine Lithium Medication for diabetes Methenamine Other diuretics Phenytoin Primidone Quinidine Sodium bicarbonate Stimulant medications, such as dextroamphetamine This list may not describe all possible interactions. Give your health care provider a list of all the medicines, herbs,  non-prescription drugs, or dietary supplements you use. Also tell them if you smoke, drink alcohol, or use illegal drugs. Some items may interact with your medicine. What should I watch for while using this medication? Visit your care team for regular checks on your progress. Tell your care team if your symptoms do not start to get better or if they get worse. This medication may cause serious skin reactions. They can happen weeks to months after starting the medication. Contact your care team right away if you notice fevers or flu-like symptoms with a rash. The rash may be red or purple and then turn into blisters or peeling of the skin. Or, you might notice a red rash with swelling of the face, lips, or lymph nodes in your neck or under your arms. This medication may affect your coordination, reaction time, or judgment. Do not drive or operate machinery until you know how this medication affects you. Sit up or stand slowly to reduce the risk of dizzy or fainting spells. What side effects may I notice from receiving this medication? Side effects that you should report to your care team as soon as possible: Allergic reactions--skin rash, itching, hives, swelling of the face, lips, tongue, or throat Aplastic anemia--unusual weakness or fatigue, dizziness, headache, trouble breathing, increased bleeding or bruising High acid level--trouble breathing, unusual weakness or fatigue, confusion, headache, fast or irregular heartbeat, nausea, vomiting Infection--fever, chills, cough, or sore throat Kidney stones--blood in the  urine, pain or trouble passing urine, pain in the lower back or sides Liver injury--right upper belly pain, loss of appetite, nausea, light-colored stool, dark yellow or brown urine, yellowing skin or eyes, unusual weakness or fatigue Low potassium level--muscle pain or cramps, unusual weakness or fatigue, fast or irregular heartbeat, constipation Redness, blistering, peeling, or loosening  of the skin, including inside the mouth Side effects that usually do not require medical attention (report to your care team if they continue or are bothersome): Blurry vision Change in taste Loss of appetite Pain, tingling, or numbness in the hands or feet This list may not describe all possible side effects. Call your doctor for medical advice about side effects. You may report side effects to FDA at 1-800-FDA-1088. Where should I keep my medication? Keep out of the reach of children and pets. Store at room temperature between 20 and 25 degrees C (68 and 77 degrees F). Throw away any unused medication after the expiration date. NOTE: This sheet is a summary. It may not cover all possible information. If you have questions about this medicine, talk to your doctor, pharmacist, or health care provider.  2024 Elsevier/Gold Standard (2021-12-08 00:00:00)
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
## 2023-04-26 LAB — TSH RFX ON ABNORMAL TO FREE T4: TSH: 0.679 u[IU]/mL (ref 0.450–4.500)

## 2023-05-25 LAB — OB RESULTS CONSOLE HIV ANTIBODY (ROUTINE TESTING): HIV: NONREACTIVE

## 2023-05-25 LAB — OB RESULTS CONSOLE RUBELLA ANTIBODY, IGM: Rubella: IMMUNE

## 2023-05-25 LAB — OB RESULTS CONSOLE HEPATITIS B SURFACE ANTIGEN: Hepatitis B Surface Ag: NEGATIVE

## 2023-05-25 LAB — OB RESULTS CONSOLE GC/CHLAMYDIA
Chlamydia: NEGATIVE
Neisseria Gonorrhea: NEGATIVE

## 2023-05-25 LAB — OB RESULTS CONSOLE RPR: RPR: NONREACTIVE

## 2023-05-25 LAB — HEPATITIS C ANTIBODY: HCV Ab: NEGATIVE

## 2023-05-26 ENCOUNTER — Other Ambulatory Visit: Payer: Medicaid Other

## 2023-05-30 ENCOUNTER — Ambulatory Visit
Admission: RE | Admit: 2023-05-30 | Discharge: 2023-05-30 | Disposition: A | Discharge status: Home / Self Care | Payer: Medicaid Other | Source: Ambulatory Visit | Attending: Obstetrics and Gynecology | Admitting: Obstetrics and Gynecology

## 2023-05-30 ENCOUNTER — Ambulatory Visit: Admission: RE | Admit: 2023-05-30 | Payer: Medicaid Other | Source: Ambulatory Visit

## 2023-05-30 DIAGNOSIS — N6311 Unspecified lump in the right breast, upper outer quadrant: Secondary | ICD-10-CM

## 2023-06-06 ENCOUNTER — Other Ambulatory Visit: Payer: Medicaid Other

## 2023-08-01 ENCOUNTER — Ambulatory Visit: Payer: Medicaid Other | Admitting: Adult Health

## 2023-08-16 ENCOUNTER — Telehealth: Payer: Self-pay | Admitting: Neurology

## 2023-08-16 NOTE — Telephone Encounter (Signed)
Pt rescheduled appt due baby is sick

## 2023-08-17 ENCOUNTER — Ambulatory Visit: Payer: Medicaid Other | Admitting: Adult Health

## 2023-11-08 ENCOUNTER — Telehealth (HOSPITAL_COMMUNITY): Payer: Self-pay | Admitting: *Deleted

## 2023-11-08 ENCOUNTER — Encounter (HOSPITAL_COMMUNITY): Payer: Self-pay | Admitting: *Deleted

## 2023-11-09 NOTE — L&D Delivery Note (Signed)
 Delivery Note Margaret Caldwell is a [id number] at [redacted]w[redacted]d who had a spontaneous delivery at 1250 a viable female was delivered via  ROA.  APGAR: 9, 9; weight pending  .     Admitted for induction for polyhydramnios. Induced with cytotec . Progressed quickly after second dose of cytotec . Received epidural for pain management. AROM copious clear fluid at 1215 at 9.5cm. Pushed for 25 minutes with recurrent variable decelerations with contractions into the 80-90s that recovered between contractions. Baby was delivered without difficulty. No nuchal cord.  Delayed cord clamping for 60 seconds. Delivery of placenta was spontaneous. Placenta was found to be intact, 3 -vessel cord was noted. Gush of blood with placenta delivery, TXA 1g administered and used straight catheter to empty bladder fo 50cc urine.  1st degree perineal laceration was repaired in the normal sterile fashion with 3-0 vicryl.  Persistent trickle of blood with first RN fundal rub. I performed a bimanual exam and evacuated 100cc of clot with good response. IM metherine 0.2mg  administered.   Estimated blood loss 400cc.  Instrument and gauze counts were correct at the end of the procedure.   Placenta status: to L&D  Anesthesia:  epidural Episiotomy:  none Lacerations:  1st degree Suture Repair: 3.0 vicryl Est. Blood Loss (mL):  400  Mom to postpartum.  Baby to Couplet care / Skin to Skin. Mom is currently undecided about circumcision for baby boy.   Margaret Caldwell 11/17/2023, 1:22 PM

## 2023-11-10 ENCOUNTER — Encounter (HOSPITAL_COMMUNITY): Payer: Self-pay | Admitting: *Deleted

## 2023-11-10 NOTE — Telephone Encounter (Signed)
 Preadmission screen

## 2023-11-11 LAB — OB RESULTS CONSOLE GBS: GBS: POSITIVE

## 2023-11-15 ENCOUNTER — Other Ambulatory Visit: Payer: Self-pay | Admitting: Obstetrics and Gynecology

## 2023-11-15 DIAGNOSIS — O401XX0 Polyhydramnios, first trimester, not applicable or unspecified: Secondary | ICD-10-CM

## 2023-11-16 ENCOUNTER — Inpatient Hospital Stay (HOSPITAL_COMMUNITY): Payer: Medicaid Other

## 2023-11-17 ENCOUNTER — Encounter (HOSPITAL_COMMUNITY): Payer: Self-pay | Admitting: Obstetrics and Gynecology

## 2023-11-17 ENCOUNTER — Inpatient Hospital Stay (HOSPITAL_COMMUNITY): Payer: Medicaid Other | Admitting: Anesthesiology

## 2023-11-17 ENCOUNTER — Inpatient Hospital Stay (HOSPITAL_COMMUNITY)
Admission: RE | Admit: 2023-11-17 | Discharge: 2023-11-18 | DRG: 806 | Disposition: A | Discharge status: Home / Self Care | Payer: Medicaid Other | Attending: Obstetrics and Gynecology | Admitting: Obstetrics and Gynecology

## 2023-11-17 ENCOUNTER — Other Ambulatory Visit: Payer: Self-pay

## 2023-11-17 DIAGNOSIS — Z833 Family history of diabetes mellitus: Secondary | ICD-10-CM | POA: Diagnosis not present

## 2023-11-17 DIAGNOSIS — Z3A39 39 weeks gestation of pregnancy: Secondary | ICD-10-CM | POA: Diagnosis not present

## 2023-11-17 DIAGNOSIS — O401XX0 Polyhydramnios, first trimester, not applicable or unspecified: Secondary | ICD-10-CM

## 2023-11-17 DIAGNOSIS — O24420 Gestational diabetes mellitus in childbirth, diet controlled: Secondary | ICD-10-CM | POA: Diagnosis present

## 2023-11-17 DIAGNOSIS — O99354 Diseases of the nervous system complicating childbirth: Secondary | ICD-10-CM | POA: Diagnosis present

## 2023-11-17 DIAGNOSIS — O9902 Anemia complicating childbirth: Secondary | ICD-10-CM | POA: Diagnosis present

## 2023-11-17 DIAGNOSIS — O403XX0 Polyhydramnios, third trimester, not applicable or unspecified: Principal | ICD-10-CM | POA: Diagnosis present

## 2023-11-17 DIAGNOSIS — O99824 Streptococcus B carrier state complicating childbirth: Secondary | ICD-10-CM | POA: Diagnosis present

## 2023-11-17 DIAGNOSIS — G932 Benign intracranial hypertension: Secondary | ICD-10-CM | POA: Diagnosis present

## 2023-11-17 HISTORY — DX: Benign intracranial hypertension: G93.2

## 2023-11-17 LAB — GLUCOSE, CAPILLARY
Glucose-Capillary: 88 mg/dL (ref 70–99)
Glucose-Capillary: 84 mg/dL (ref 70–99)
Glucose-Capillary: 76 mg/dL (ref 70–99)

## 2023-11-17 LAB — CBC
HCT: 32 % — ABNORMAL LOW (ref 36.0–46.0)
Hemoglobin: 10.4 g/dL — ABNORMAL LOW (ref 12.0–15.0)
MCH: 25.8 pg — ABNORMAL LOW (ref 26.0–34.0)
MCHC: 32.5 g/dL (ref 30.0–36.0)
MCV: 79.4 fL — ABNORMAL LOW (ref 80.0–100.0)
Platelets: 198 10*3/uL (ref 150–400)
RBC: 4.03 MIL/uL (ref 3.87–5.11)
RDW: 13.3 % (ref 11.5–15.5)
WBC: 4 10*3/uL (ref 4.0–10.5)
nRBC: 0 % (ref 0.0–0.2)

## 2023-11-17 LAB — TYPE AND SCREEN
ABO/RH(D): O POS
Antibody Screen: NEGATIVE

## 2023-11-17 LAB — RPR: RPR Ser Ql: NONREACTIVE

## 2023-11-17 MED ORDER — IBUPROFEN 600 MG PO TABS
600.0000 mg | ORAL_TABLET | Freq: Four times a day (QID) | ORAL | Status: DC
  Administered 2023-11-17 – 2023-11-18 (×4): 600 mg via ORAL
  Filled 2023-11-17 (×4): qty 1

## 2023-11-17 MED ORDER — BISACODYL 10 MG RE SUPP: 10.0000 mg | Freq: Every day | RECTAL | Status: DC | PRN

## 2023-11-17 MED ORDER — PRENATAL MULTIVITAMIN CH
1.0000 | ORAL_TABLET | Freq: Every day | ORAL | Status: DC
  Administered 2023-11-18: 1 via ORAL
  Filled 2023-11-17: qty 1

## 2023-11-17 MED ORDER — LIDOCAINE HCL (PF) 1 % IJ SOLN
INTRAMUSCULAR | Status: DC | PRN
  Administered 2023-11-17: 3 mL via EPIDURAL
  Administered 2023-11-17: 5 mL via EPIDURAL
  Administered 2023-11-17: 2 mL via EPIDURAL

## 2023-11-17 MED ORDER — SODIUM CHLORIDE 0.9 % IV SOLN
5.0000 10*6.[IU] | Freq: Once | INTRAVENOUS | Status: AC
  Administered 2023-11-17: 5 10*6.[IU] via INTRAVENOUS
  Filled 2023-11-17: qty 5

## 2023-11-17 MED ORDER — ACETAZOLAMIDE ER 500 MG PO CP12
1000.0000 mg | ORAL_CAPSULE | Freq: Two times a day (BID) | ORAL | Status: DC
  Filled 2023-11-17 (×3): qty 2

## 2023-11-17 MED ORDER — LIDOCAINE HCL (PF) 1 % IJ SOLN: 30.0000 mL | INTRAMUSCULAR | Status: DC | PRN

## 2023-11-17 MED ORDER — PENICILLIN G POT IN DEXTROSE 60000 UNIT/ML IV SOLN
3.0000 10*6.[IU] | INTRAVENOUS | Status: DC
  Administered 2023-11-17: 3 10*6.[IU] via INTRAVENOUS
  Filled 2023-11-17: qty 50

## 2023-11-17 MED ORDER — LACTATED RINGERS IV SOLN: 500.0000 mL | Freq: Once | INTRAVENOUS | Status: DC

## 2023-11-17 MED ORDER — ONDANSETRON HCL 4 MG PO TABS: 4.0000 mg | ORAL_TABLET | ORAL | Status: DC | PRN

## 2023-11-17 MED ORDER — DIPHENHYDRAMINE HCL 50 MG/ML IJ SOLN: 12.5000 mg | INTRAMUSCULAR | Status: DC | PRN

## 2023-11-17 MED ORDER — WITCH HAZEL-GLYCERIN EX PADS: 1.0000 | MEDICATED_PAD | CUTANEOUS | Status: DC | PRN

## 2023-11-17 MED ORDER — SIMETHICONE 80 MG PO CHEW: 80.0000 mg | CHEWABLE_TABLET | ORAL | Status: DC | PRN

## 2023-11-17 MED ORDER — PHENYLEPHRINE 80 MCG/ML (10ML) SYRINGE FOR IV PUSH (FOR BLOOD PRESSURE SUPPORT): 80.0000 ug | PREFILLED_SYRINGE | INTRAVENOUS | Status: DC | PRN

## 2023-11-17 MED ORDER — TRANEXAMIC ACID-NACL 1000-0.7 MG/100ML-% IV SOLN
INTRAVENOUS | Status: AC
  Filled 2023-11-17: qty 100

## 2023-11-17 MED ORDER — METHYLERGONOVINE MALEATE 0.2 MG/ML IJ SOLN
INTRAMUSCULAR | Status: AC
  Administered 2023-11-17: 0.2 mg
  Filled 2023-11-17: qty 1

## 2023-11-17 MED ORDER — SOD CITRATE-CITRIC ACID 500-334 MG/5ML PO SOLN: 30.0000 mL | ORAL | Status: DC | PRN

## 2023-11-17 MED ORDER — COCONUT OIL OIL: 1.0000 | TOPICAL_OIL | Status: DC | PRN

## 2023-11-17 MED ORDER — TERBUTALINE SULFATE 1 MG/ML IJ SOLN: 0.2500 mg | Freq: Once | INTRAMUSCULAR | Status: DC | PRN

## 2023-11-17 MED ORDER — FENTANYL-BUPIVACAINE-NACL 0.5-0.125-0.9 MG/250ML-% EP SOLN
12.0000 mL/h | EPIDURAL | Status: DC | PRN
  Administered 2023-11-17: 12 mL/h via EPIDURAL
  Filled 2023-11-17: qty 250

## 2023-11-17 MED ORDER — OXYCODONE-ACETAMINOPHEN 5-325 MG PO TABS: 2.0000 | ORAL_TABLET | ORAL | Status: DC | PRN

## 2023-11-17 MED ORDER — OXYTOCIN-SODIUM CHLORIDE 30-0.9 UT/500ML-% IV SOLN
1.0000 m[IU]/min | INTRAVENOUS | Status: DC
  Filled 2023-11-17: qty 500

## 2023-11-17 MED ORDER — ONDANSETRON HCL 4 MG/2ML IJ SOLN: 4.0000 mg | INTRAMUSCULAR | Status: DC | PRN

## 2023-11-17 MED ORDER — OXYTOCIN-SODIUM CHLORIDE 30-0.9 UT/500ML-% IV SOLN: 2.5000 [IU]/h | INTRAVENOUS | Status: DC

## 2023-11-17 MED ORDER — DOCUSATE SODIUM 100 MG PO CAPS
100.0000 mg | ORAL_CAPSULE | Freq: Two times a day (BID) | ORAL | Status: DC
  Administered 2023-11-18: 100 mg via ORAL
  Filled 2023-11-17: qty 1

## 2023-11-17 MED ORDER — ACETAMINOPHEN 325 MG PO TABS: 650.0000 mg | ORAL_TABLET | ORAL | Status: DC | PRN

## 2023-11-17 MED ORDER — OXYCODONE HCL 5 MG PO TABS: 10.0000 mg | ORAL_TABLET | ORAL | Status: DC | PRN

## 2023-11-17 MED ORDER — MISOPROSTOL 50MCG HALF TABLET
50.0000 ug | ORAL_TABLET | ORAL | Status: DC | PRN
  Administered 2023-11-17 (×2): 50 ug via VAGINAL
  Filled 2023-11-17 (×2): qty 1

## 2023-11-17 MED ORDER — OXYCODONE HCL 5 MG PO TABS: 5.0000 mg | ORAL_TABLET | ORAL | Status: DC | PRN

## 2023-11-17 MED ORDER — EPHEDRINE 5 MG/ML INJ: 10.0000 mg | INTRAVENOUS | Status: DC | PRN

## 2023-11-17 MED ORDER — FLEET ENEMA RE ENEM: 1.0000 | ENEMA | Freq: Every day | RECTAL | Status: DC | PRN

## 2023-11-17 MED ORDER — BENZOCAINE-MENTHOL 20-0.5 % EX AERO
1.0000 | INHALATION_SPRAY | CUTANEOUS | Status: DC | PRN
  Administered 2023-11-17: 1 via TOPICAL
  Filled 2023-11-17: qty 56

## 2023-11-17 MED ORDER — FENTANYL CITRATE (PF) 100 MCG/2ML IJ SOLN
50.0000 ug | INTRAMUSCULAR | Status: DC | PRN
Start: 2023-11-17 — End: 2023-11-17
  Administered 2023-11-17 (×2): 100 ug via INTRAVENOUS
  Filled 2023-11-17 (×2): qty 2

## 2023-11-17 MED ORDER — OXYTOCIN BOLUS FROM INFUSION: 333.0000 mL | Freq: Once | INTRAVENOUS | Status: DC

## 2023-11-17 MED ORDER — ACETAMINOPHEN 325 MG PO TABS
650.0000 mg | ORAL_TABLET | ORAL | Status: DC | PRN
  Administered 2023-11-17: 650 mg via ORAL
  Filled 2023-11-17: qty 2

## 2023-11-17 MED ORDER — LACTATED RINGERS IV SOLN: INTRAVENOUS | Status: DC

## 2023-11-17 MED ORDER — DIPHENHYDRAMINE HCL 25 MG PO CAPS: 25.0000 mg | ORAL_CAPSULE | Freq: Four times a day (QID) | ORAL | Status: DC | PRN

## 2023-11-17 MED ORDER — OXYCODONE-ACETAMINOPHEN 5-325 MG PO TABS: 1.0000 | ORAL_TABLET | ORAL | Status: DC | PRN

## 2023-11-17 MED ORDER — DIBUCAINE (PERIANAL) 1 % EX OINT: 1.0000 | TOPICAL_OINTMENT | CUTANEOUS | Status: DC | PRN

## 2023-11-17 MED ORDER — ONDANSETRON HCL 4 MG/2ML IJ SOLN
4.0000 mg | Freq: Four times a day (QID) | INTRAMUSCULAR | Status: DC | PRN
  Filled 2023-11-17: qty 2

## 2023-11-17 MED ORDER — LACTATED RINGERS IV SOLN: 500.0000 mL | INTRAVENOUS | Status: DC | PRN

## 2023-11-17 MED ORDER — TETANUS-DIPHTH-ACELL PERTUSSIS 5-2.5-18.5 LF-MCG/0.5 IM SUSY: 0.5000 mL | PREFILLED_SYRINGE | Freq: Once | INTRAMUSCULAR | Status: DC

## 2023-11-17 NOTE — Lactation Note (Signed)
 This note was copied from a baby's chart. Lactation Consultation Note  Patient Name: Margaret Caldwell Unijb'd Date: 11/17/2023 Age:34 hours Reason for consult: Initial assessment;Term;Maternal endocrine disorder LC attempted to consult with a Video interpreter, but MOB stated that she does not need an interpreter because she speaks English.  P4- MOB reports that infant is nursing well and denies experiencing any pain or pinching. MOB plans to offer both breast and formula. MOB is very experienced with breastfeeding. She breast fed her first child for 9 months, her second child for 1.5 years and her third child for 2 years. MOB declines further follow up with lactation team and states that she will call if she needs help.  LC reviewed feeding infant on cue 8-12x in 24 hrs, not allowing infant to go over 3 hrs without a feeding, CDC milk storage guidelines and LC services handout. LC encouraged MOB to call for further assistance as needed. [facility] referral sent for DEBP.  Maternal Data Does the patient have breastfeeding experience prior to this delivery?: Yes How long did the patient breastfeed?: 9 months with first child, 1.5 years with second child and 2 years with third child  Feeding Mother's Current Feeding Choice: Breast Milk and Formula Nipple Type: Slow - flow  Lactation Tools Discussed/Used Pump Education: Milk Storage  Interventions Interventions: Breast feeding basics reviewed;Education;LC Services brochure  Discharge Discharge Education: Engorgement and breast care;Warning signs for feeding baby Pump:  [facility] referral sent)  Consult Status Consult Status: Complete (mother declined follow up) Date: 11/17/23    Recardo Hoit BS, IBCLC 11/17/2023, 10:19 PM

## 2023-11-17 NOTE — Anesthesia Procedure Notes (Signed)
 Epidural Patient location during procedure: OB Start time: 11/17/2023 12:03 PM End time: 11/17/2023 12:10 PM  Staffing Anesthesiologist: Epifanio Charleston, MD Performed: anesthesiologist   Preanesthetic Checklist Completed: patient identified, IV checked, site marked, risks and benefits discussed, surgical consent, monitors and equipment checked, pre-op evaluation and timeout performed  Epidural Patient position: sitting Prep: DuraPrep and site prepped and draped Patient monitoring: continuous pulse ox and blood pressure Approach: midline Location: L4-L5 Injection technique: LOR air  Needle:  Needle type: Tuohy  Needle gauge: 17 G Needle length: 9 cm and 9 Needle insertion depth: 7 cm Catheter type: closed end flexible Catheter size: 19 Gauge Catheter at skin depth: 12 cm Test dose: negative  Assessment Events: blood not aspirated, no cerebrospinal fluid, injection not painful, no injection resistance, no paresthesia and negative IV test
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
## 2023-11-17 NOTE — Anesthesia Preprocedure Evaluation (Addendum)
 Anesthesia Evaluation  Patient identified by MRN, date of birth, ID band Patient awake    Reviewed: Allergy & Precautions, Patient's Chart, lab work & pertinent test results  Airway Mallampati: II  TM Distance: >3 FB Neck ROM: Full    Dental   Pulmonary neg pulmonary ROS   breath sounds clear to auscultation       Cardiovascular negative cardio ROS  Rhythm:Regular Rate:Normal     Neuro/Psych negative neurological ROS     GI/Hepatic negative GI ROS, Neg liver ROS,,,  Endo/Other  diabetes, Type 2    Renal/GU negative Renal ROS     Musculoskeletal   Abdominal   Peds  Hematology  (+) Blood dyscrasia, anemia   Anesthesia Other Findings   Reproductive/Obstetrics (+) Pregnancy                             Anesthesia Physical Anesthesia Plan  ASA: 2  Anesthesia Plan: Epidural   Post-op Pain Management:    Induction:   PONV Risk Score and Plan: 2 and Treatment may vary due to age or medical condition  Airway Management Planned: Natural Airway  Additional Equipment:   Intra-op Plan:   Post-operative Plan:   Informed Consent: I have reviewed the patients History and Physical, chart, labs and discussed the procedure including the risks, benefits and alternatives for the proposed anesthesia with the patient or authorized representative who has indicated his/her understanding and acceptance.       Plan Discussed with:   Anesthesia Plan Comments:        Anesthesia Quick Evaluation

## 2023-11-18 LAB — CBC
HCT: 28.3 % — ABNORMAL LOW (ref 36.0–46.0)
Hemoglobin: 9.1 g/dL — ABNORMAL LOW (ref 12.0–15.0)
MCH: 26 pg (ref 26.0–34.0)
MCHC: 32.2 g/dL (ref 30.0–36.0)
MCV: 80.9 fL (ref 80.0–100.0)
Platelets: 172 10*3/uL (ref 150–400)
RBC: 3.5 MIL/uL — ABNORMAL LOW (ref 3.87–5.11)
RDW: 13.5 % (ref 11.5–15.5)
WBC: 5.6 10*3/uL (ref 4.0–10.5)
nRBC: 0 % (ref 0.0–0.2)

## 2023-11-18 MED ORDER — FERROUS SULFATE DRIED 200 (65 FE) MG PO TABS: 1.0000 | ORAL_TABLET | Freq: Every day | ORAL | 1 refills | Status: DC

## 2023-11-18 MED ORDER — IBUPROFEN 600 MG PO TABS: 600.0000 mg | ORAL_TABLET | Freq: Four times a day (QID) | ORAL | 1 refills | Status: DC | PRN

## 2023-11-18 NOTE — Anesthesia Postprocedure Evaluation (Signed)
 Anesthesia Post Note  Patient: Margaret Caldwell  Procedure(s) Performed: AN AD HOC LABOR EPIDURAL     Patient location during evaluation: Mother Baby Anesthesia Type: Epidural Level of consciousness: awake and alert Pain management: pain level controlled Vital Signs Assessment: post-procedure vital signs reviewed and stable Respiratory status: spontaneous breathing, nonlabored ventilation and respiratory function stable Cardiovascular status: stable Postop Assessment: no headache, no backache, epidural receding, no apparent nausea or vomiting, patient able to bend at knees, able to ambulate and adequate PO intake Anesthetic complications: no   No notable events documented.  Last Vitals:  Vitals:   11/17/23 2335 11/18/23 0548  BP: 116/65 110/68  Pulse: 80 85  Resp: 18 18  Temp: 36.7 C 36.9 C  SpO2:  99%    Last Pain:  Vitals:   11/18/23 0825  TempSrc:   PainSc: 0-No pain   Pain Goal:                   [name]

## 2023-11-18 NOTE — Discharge Summary (Signed)
 Postpartum Discharge Summary  Date of Service updated      Patient Name: Margaret Caldwell DOB: [birth year] MRN: [id number]  Date of admission: 11/17/2023 Delivery date:11/17/2023 Delivering provider: DIEDRE BROWNING E Date of discharge: 11/18/2023  Admitting diagnosis: Polyhydramnios affecting pregnancy in third trimester [O40.3XX0] Intrauterine pregnancy: [redacted]w[redacted]d     Secondary diagnosis:  Principal Problem:   Polyhydramnios affecting pregnancy in third trimester  Additional problems: benign intracranial HTN   Discharge diagnosis: Term Pregnancy Delivered and Anemia - of acute blood loss with no significance clinically                                        Post partum procedures: n/a Augmentation: AROM and Cytotec  Complications: None  Hospital course: Induction of Labor With Vaginal Delivery   33 y.o. yo [id number] at [redacted]w[redacted]d was admitted to the hospital 11/17/2023 for induction of labor.  Indication for induction:  polyhydranmios  .  Patient had an labor course complicated byn/a Membrane Rupture Time/Date: 12:15 PM,11/17/2023  Delivery Method:Vaginal, Spontaneous Operative Delivery:N/A Episiotomy: None Lacerations:  1st degree Details of delivery can be found in separate delivery note.  Patient had a postpartum course complicated byn/a. Patient is discharged home 11/18/23.  Newborn Data: Birth date:11/17/2023 Birth time:12:50 PM Gender:Female Living status:Living Apgars:9 ,9  Weight:3289 g  Magnesium Sulfate received: No BMZ received: No T-DaP: declined  Flu: N/A RSV Vaccine received: No Transfusion:No Immunizations administered: There is no immunization history for the selected administration types on file for this patient.  Physical exam  Vitals:   11/17/23 1635 11/17/23 2021 11/17/23 2335 11/18/23 0548  BP: 116/78 112/63 116/65 110/68  Pulse: 67 95 80 85  Resp: 16 18 18 18   Temp: 98 F (36.7 C) 98.1 F (36.7 C) 98 F (36.7 C) 98.5 F (36.9 C)  TempSrc: Oral Oral  Oral Oral  SpO2: 100% 100%  99%  Weight:      Height:       General: alert, cooperative, and no distress Lochia: appropriate Uterine Fundus: firm Incision: N/A DVT Evaluation: No evidence of DVT seen on physical exam. Labs: Lab Results  Component Value Date   WBC 5.6 11/18/2023   HGB 9.1 (L) 11/18/2023   HCT 28.3 (L) 11/18/2023   MCV 80.9 11/18/2023   PLT 172 11/18/2023      Latest Ref Rng & Units 04/30/2022   10:00 AM  CMP  Glucose 70 - 99 mg/dL 98   BUN 6 - 23 mg/dL 12   Creatinine 9.59 - 1.20 mg/dL 9.30   Sodium 864 - 854 mEq/L 138   Potassium 3.5 - 5.1 mEq/L 4.3   Chloride 96 - 112 mEq/L 101   CO2 19 - 32 mEq/L 30   Calcium 8.4 - 10.5 mg/dL 9.0    Edinburgh Score:    11/18/2023    8:25 AM  Edinburgh Postnatal Depression Scale Screening Tool  I have been able to laugh and see the funny side of things. 0  I have looked forward with enjoyment to things. 0  I have blamed myself unnecessarily when things went wrong. 0  I have been anxious or worried for no good reason. 0  I have felt scared or panicky for no good reason. 0  Things have been getting on top of me. 0  I have been so unhappy that I have had difficulty sleeping. 0  I  have felt sad or miserable. 0  I have been so unhappy that I have been crying. 0  The thought of harming myself has occurred to me. 0  Edinburgh Postnatal Depression Scale Total 0      After visit meds:  Allergies as of 11/18/2023   No Known Allergies      Medication List     TAKE these medications    acetaZOLAMIDE  ER 500 MG capsule Commonly known as: DIAMOX  Take 2 capsules (1,000 mg total) by mouth 2 (two) times daily.   Ferrous Sulfate  Dried 200 (65 Fe) MG Tabs Take 1 tablet by mouth daily.   ibuprofen  600 MG tablet Commonly known as: ADVIL  Take 1 tablet (600 mg total) by mouth every 6 (six) hours as needed for moderate pain (pain score 4-6) or cramping.         Discharge home in stable condition Infant Feeding:  Breast Infant Disposition:home with mother Discharge instruction: per After Visit Summary and Postpartum booklet. Activity: Advance as tolerated. Pelvic rest for 6 weeks.  Diet: routine diet Anticipated Birth Control: Unsure Postpartum Appointment:6 weeks Additional Postpartum F/U: Postpartum Depression checkup Future Appointments: Future Appointments  Date Time Provider Department Center  01/31/2024  8:45 AM Whitfield Raisin, NP [facility] None   Follow up Visit:  Follow-up Information     [facility] Follow up in 6 week(s).   Why: For postpartum visit Contact information: [address]  [address] [place] [address] [phone]                     11/18/2023 Ted LELON Solo, DO

## 2023-11-18 NOTE — Progress Notes (Signed)
 Post Partum Day 1 Subjective: no complaints, up ad lib, voiding, tolerating PO, + flatus, and lochia mild. She is breast/bottlefeeding. She has no complaints of HA, CP or SOB. She desires discharge home today. Pt does not want to get baby circumcised. Aware may need pediatric urologist later and ok with that    Objective: Blood pressure 110/68, pulse 85, temperature 98.5 F (36.9 C), temperature source Oral, resp. rate 18, height 5' 2 (1.575 m), weight 95.5 kg, SpO2 99%, unknown if currently breastfeeding.  Physical Exam:  General: alert, cooperative, and no distress Lochia: appropriate Uterine Fundus: firm Incision: n/a DVT Evaluation: No evidence of DVT seen on physical exam. No significant calf/ankle edema.  Recent Labs    11/17/23 0237 11/18/23 0449  HGB 10.4* 9.1*  HCT 32.0* 28.3*    Assessment/Plan: Discharge home and Breastfeeding and bottlefeeding  Instructions reviewed  6 week pp visit    LOS: 1 day   Wiley Flicker W Contrell Ballentine, DO 11/18/2023, 12:30 PM

## 2023-11-18 NOTE — Discharge Instructions (Signed)
 Call office with any concerns [phone]

## 2023-11-28 ENCOUNTER — Telehealth (HOSPITAL_COMMUNITY): Payer: Self-pay | Admitting: *Deleted

## 2023-11-28 NOTE — Telephone Encounter (Signed)
11/28/2023  Name: Margaret Caldwell MRN: [id number] DOB: [birth year]  Reason for Call:  Transition of Care Hospital Discharge Call  Contact Status: Patient Contact Status: Message  Language assistant needed: Interpreter Mode: Telephonic Interpreter Interpreter Name: Tasia Catchings [id number]        Follow-Up Questions:    Inocente Salles Postnatal Depression Scale:  In the Past 7 Days:    PHQ2-9 Depression Scale:     Discharge Follow-up:    Post-discharge interventions: NA  Salena Saner, RN 11/28/2023 14:42
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
## 2024-01-31 ENCOUNTER — Ambulatory Visit: Payer: Medicaid Other | Admitting: Adult Health

## 2024-01-31 ENCOUNTER — Encounter: Payer: Self-pay | Admitting: Adult Health

## 2024-01-31 VITALS — BP 116/78 | Ht 63.0 in | Wt 207.0 lb

## 2024-01-31 DIAGNOSIS — G932 Benign intracranial hypertension: Secondary | ICD-10-CM

## 2024-01-31 NOTE — Patient Instructions (Addendum)
 Your Plan:   Can hold off on restarting Diamox at this time but please call office and schedule follow-up visit with ophthalmology for any recurrent headaches, vision changes or tinnitus  Follow-up with ophthalmology in September as scheduled, please have them send visit notes after appointment for review     Follow-up in October or call earlier if needed     Thank you for coming to see [place] at [facility]. I hope we have been able to provide you high quality care today.  You may receive a patient satisfaction survey over the next few weeks. We would appreciate your feedback and comments so that we may continue to improve ourselves and the health of our patients.

## 2024-09-04 ENCOUNTER — Ambulatory Visit: Admitting: Adult Health

## 2024-10-08 LAB — OPHTHALMOLOGY REPORT-SCANNED

## 2024-10-11 ENCOUNTER — Telehealth: Payer: Self-pay | Admitting: Adult Health

## 2024-10-11 NOTE — Telephone Encounter (Signed)
 Please contact patient to see when she restarted acetazolamide  and indication for restarting.  Per patient at prior visit, scheduled for follow-up ophthalmology exam in September and was supposed to follow-up after that time. Please request notes from ophthalmologist for review. Thank you.

## 2024-10-11 NOTE — Telephone Encounter (Signed)
 Pt called to request Medication refill acetaZOLAMIDE  ER (DIAMOX ) 500 MG capsule    Pt medication is to be sent to : [facility] [address] [place] - [address] Phone: [phone]  Fax: [phone]

## 2024-10-11 NOTE — Telephone Encounter (Signed)
 Patient called requesting a refill on Diamox , but per her last visit it looks like she was taken off the medication please advise.   Last office visit : 01/31/24 Next office visit :  03/25/25 Per last note - can hold off on restarting Diamox  as she is currently asymptomatic and recent ophthalmology exam without evidence of papilledema (per patient report)

## 2024-10-14 ENCOUNTER — Encounter (HOSPITAL_COMMUNITY): Payer: Self-pay | Admitting: Emergency Medicine

## 2024-10-14 ENCOUNTER — Ambulatory Visit (HOSPITAL_COMMUNITY): Admission: EM | Admit: 2024-10-14 | Discharge: 2024-10-14 | Disposition: A | Discharge status: Home / Self Care

## 2024-10-14 DIAGNOSIS — M7918 Myalgia, other site: Secondary | ICD-10-CM

## 2024-10-14 MED ORDER — DICLOFENAC SODIUM 50 MG PO TBEC: 50.0000 mg | DELAYED_RELEASE_TABLET | Freq: Two times a day (BID) | ORAL | 1 refills | Status: AC

## 2024-10-14 MED ORDER — BACLOFEN 10 MG PO TABS: 10.0000 mg | ORAL_TABLET | Freq: Three times a day (TID) | ORAL | 0 refills | Status: AC
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
## 2024-10-15 NOTE — Telephone Encounter (Signed)
 I called the patent and left a message for her to call the office back regarding Jessica's note.

## 2024-10-18 NOTE — Telephone Encounter (Signed)
 I called the patient and left a message for the patient to call the office back regarding previous note.

## 2024-10-18 NOTE — Telephone Encounter (Signed)
 Patient called back and confirmed she did see her Ophthalmologist. She stated she has not started Acetazolamide  (Diamox ) and called because she would like to restart medication.    I called [facility] to request notes and left a message for them to call the office back  [phone]

## 2024-10-24 ENCOUNTER — Telehealth: Payer: Self-pay | Admitting: *Deleted

## 2024-10-24 NOTE — Telephone Encounter (Signed)
 Notes have been sent for Margaret Caldwell to review and have been placed in her office.

## 2024-10-24 NOTE — Telephone Encounter (Signed)
 Patient had an appointment December 1st and I was able to contact medical records at the [facility]. She is sending notes over for Harlene to review.

## 2024-10-24 NOTE — Telephone Encounter (Signed)
 Received notes from visit at [facility] on 10/08/24. Notes placed in [place] NP's office for review.

## 2024-10-25 MED ORDER — ACETAZOLAMIDE ER 500 MG PO CP12: 500.0000 mg | ORAL_CAPSULE | Freq: Two times a day (BID) | ORAL | 5 refills | Status: AC

## 2024-10-25 NOTE — Telephone Encounter (Signed)
I called and LMVM for pt to return call.   

## 2024-10-25 NOTE — Telephone Encounter (Signed)
 Received OV notes from [facility] from exam on 10/08/2024 for routine evaluation.  Per note, patient did not complain of any visual changes or specific ocular concerns.  Unfortunately, noted reoccurrence of bilateral disc edema and recommended restarting Diamox .  Plans for follow-up in 2 to 3 months and referral to neuro-ophthalmology.  Recommend restarting Diamox  at 500 mg twice daily. Can increase further if needed, previously on 1000mg  twice daily but has been off since 2024. Can also consider repeat LP if patient wishes. Please advise.

## 2024-10-29 NOTE — Telephone Encounter (Signed)
 I called pts husband and relayed that it was opthamololgy to see back in 2-3 months and they made the referral to neuro opthamology. He verbalized understanding.  Will call back as needed.

## 2024-10-29 NOTE — Telephone Encounter (Signed)
 Per ophthalmology note, recommend 2-3 month return visit with them and referral placed by them to be seen by neuro-ophthalmology. Will plan on following up in this office as scheduled in May.

## 2024-10-29 NOTE — Telephone Encounter (Signed)
 I called pt/ spoke to husband (ok per dpr).  I relayed the information per Jessica's message.  Pt [facility] went to [facility].  Diamox  500mg  po bid.  She has appt 5-/2026.  He will ask her about LP.  She will call back if questions.

## 2025-03-25 ENCOUNTER — Ambulatory Visit: Admitting: Adult Health
